# Patient Record
Sex: Male | Born: 1950 | Hispanic: Yes | Marital: Married | Smoking: Former smoker
Health system: Southern US, Community
[De-identification: ages and names within clinical notes are randomized; demographics above are authoritative.]

## PROBLEM LIST (undated history)

## (undated) DIAGNOSIS — I2510 Atherosclerotic heart disease of native coronary artery without angina pectoris: Secondary | ICD-10-CM

## (undated) DIAGNOSIS — E119 Type 2 diabetes mellitus without complications: Secondary | ICD-10-CM

## (undated) DIAGNOSIS — IMO0002 Reserved for concepts with insufficient information to code with codable children: Secondary | ICD-10-CM

## (undated) DIAGNOSIS — I219 Acute myocardial infarction, unspecified: Secondary | ICD-10-CM

## (undated) DIAGNOSIS — E785 Hyperlipidemia, unspecified: Secondary | ICD-10-CM

---

## 2013-04-05 DIAGNOSIS — I2510 Atherosclerotic heart disease of native coronary artery without angina pectoris: Secondary | ICD-10-CM

## 2013-04-05 HISTORY — DX: Atherosclerotic heart disease of native coronary artery without angina pectoris: I25.10

## 2014-09-17 DIAGNOSIS — I219 Acute myocardial infarction, unspecified: Secondary | ICD-10-CM

## 2014-09-17 HISTORY — PX: CORONARY ANGIOPLASTY WITH STENT PLACEMENT: SHX49

## 2014-09-17 HISTORY — DX: Acute myocardial infarction, unspecified: I21.9

## 2015-08-01 ENCOUNTER — Emergency Department (HOSPITAL_COMMUNITY): Payer: Self-pay

## 2015-08-01 ENCOUNTER — Encounter (HOSPITAL_COMMUNITY): Payer: Self-pay | Admitting: *Deleted

## 2015-08-01 ENCOUNTER — Inpatient Hospital Stay (HOSPITAL_COMMUNITY)
Admission: EM | Admit: 2015-08-01 | Discharge: 2015-08-06 | DRG: 291 | Disposition: A | Discharge status: Home / Self Care | Payer: Self-pay | Attending: Internal Medicine | Admitting: Internal Medicine

## 2015-08-01 DIAGNOSIS — I249 Acute ischemic heart disease, unspecified: Secondary | ICD-10-CM | POA: Insufficient documentation

## 2015-08-01 DIAGNOSIS — I509 Heart failure, unspecified: Secondary | ICD-10-CM | POA: Insufficient documentation

## 2015-08-01 DIAGNOSIS — E118 Type 2 diabetes mellitus with unspecified complications: Secondary | ICD-10-CM | POA: Diagnosis present

## 2015-08-01 DIAGNOSIS — E1122 Type 2 diabetes mellitus with diabetic chronic kidney disease: Secondary | ICD-10-CM | POA: Diagnosis present

## 2015-08-01 DIAGNOSIS — E119 Type 2 diabetes mellitus without complications: Secondary | ICD-10-CM

## 2015-08-01 DIAGNOSIS — N184 Chronic kidney disease, stage 4 (severe): Secondary | ICD-10-CM | POA: Diagnosis present

## 2015-08-01 DIAGNOSIS — D696 Thrombocytopenia, unspecified: Secondary | ICD-10-CM

## 2015-08-01 DIAGNOSIS — M4854XA Collapsed vertebra, not elsewhere classified, thoracic region, initial encounter for fracture: Secondary | ICD-10-CM | POA: Diagnosis present

## 2015-08-01 DIAGNOSIS — Z79899 Other long term (current) drug therapy: Secondary | ICD-10-CM

## 2015-08-01 DIAGNOSIS — R001 Bradycardia, unspecified: Secondary | ICD-10-CM | POA: Diagnosis not present

## 2015-08-01 DIAGNOSIS — I130 Hypertensive heart and chronic kidney disease with heart failure and stage 1 through stage 4 chronic kidney disease, or unspecified chronic kidney disease: Principal | ICD-10-CM | POA: Diagnosis present

## 2015-08-01 DIAGNOSIS — E785 Hyperlipidemia, unspecified: Secondary | ICD-10-CM

## 2015-08-01 DIAGNOSIS — R911 Solitary pulmonary nodule: Secondary | ICD-10-CM

## 2015-08-01 DIAGNOSIS — I252 Old myocardial infarction: Secondary | ICD-10-CM

## 2015-08-01 DIAGNOSIS — N179 Acute kidney failure, unspecified: Secondary | ICD-10-CM | POA: Diagnosis present

## 2015-08-01 DIAGNOSIS — I5043 Acute on chronic combined systolic (congestive) and diastolic (congestive) heart failure: Secondary | ICD-10-CM | POA: Diagnosis present

## 2015-08-01 DIAGNOSIS — I5041 Acute combined systolic (congestive) and diastolic (congestive) heart failure: Secondary | ICD-10-CM | POA: Insufficient documentation

## 2015-08-01 DIAGNOSIS — N189 Chronic kidney disease, unspecified: Secondary | ICD-10-CM | POA: Diagnosis present

## 2015-08-01 DIAGNOSIS — Z955 Presence of coronary angioplasty implant and graft: Secondary | ICD-10-CM

## 2015-08-01 DIAGNOSIS — Z7901 Long term (current) use of anticoagulants: Secondary | ICD-10-CM

## 2015-08-01 DIAGNOSIS — Z794 Long term (current) use of insulin: Secondary | ICD-10-CM

## 2015-08-01 DIAGNOSIS — R0602 Shortness of breath: Secondary | ICD-10-CM | POA: Diagnosis present

## 2015-08-01 DIAGNOSIS — T45511A Poisoning by anticoagulants, accidental (unintentional), initial encounter: Secondary | ICD-10-CM | POA: Diagnosis present

## 2015-08-01 DIAGNOSIS — Z7902 Long term (current) use of antithrombotics/antiplatelets: Secondary | ICD-10-CM

## 2015-08-01 DIAGNOSIS — N183 Chronic kidney disease, stage 3 (moderate): Secondary | ICD-10-CM | POA: Diagnosis present

## 2015-08-01 DIAGNOSIS — I25119 Atherosclerotic heart disease of native coronary artery with unspecified angina pectoris: Secondary | ICD-10-CM

## 2015-08-01 DIAGNOSIS — I255 Ischemic cardiomyopathy: Secondary | ICD-10-CM | POA: Diagnosis present

## 2015-08-01 DIAGNOSIS — T604X1A Toxic effect of rodenticides, accidental (unintentional), initial encounter: Secondary | ICD-10-CM

## 2015-08-01 DIAGNOSIS — E1165 Type 2 diabetes mellitus with hyperglycemia: Secondary | ICD-10-CM

## 2015-08-01 DIAGNOSIS — I2510 Atherosclerotic heart disease of native coronary artery without angina pectoris: Secondary | ICD-10-CM | POA: Diagnosis present

## 2015-08-01 HISTORY — DX: Reserved for concepts with insufficient information to code with codable children: IMO0002

## 2015-08-01 HISTORY — DX: Hyperlipidemia, unspecified: E78.5

## 2015-08-01 HISTORY — DX: Type 2 diabetes mellitus without complications: E11.9

## 2015-08-01 HISTORY — DX: Acute myocardial infarction, unspecified: I21.9

## 2015-08-01 HISTORY — DX: Atherosclerotic heart disease of native coronary artery without angina pectoris: I25.10

## 2015-08-01 LAB — I-STAT TROPONIN, ED: Troponin i, poc: 0.05 ng/mL (ref 0.00–0.08)

## 2015-08-01 LAB — BASIC METABOLIC PANEL
Anion gap: 11 (ref 5–15)
BUN: 78 mg/dL — AB (ref 6–20)
CO2: 22 mmol/L (ref 22–32)
Calcium: 8.9 mg/dL (ref 8.9–10.3)
Chloride: 105 mmol/L (ref 101–111)
Creatinine, Ser: 2.19 mg/dL — ABNORMAL HIGH (ref 0.61–1.24)
GFR calc Af Amer: 34 mL/min — ABNORMAL LOW (ref >60)
GFR, EST NON AFRICAN AMERICAN: 30 mL/min — AB (ref >60)
GLUCOSE: 302 mg/dL — AB (ref 65–99)
POTASSIUM: 4.6 mmol/L (ref 3.5–5.1)
Sodium: 138 mmol/L (ref 135–145)

## 2015-08-01 LAB — PROTIME-INR
INR: 4.76 — ABNORMAL HIGH (ref 0.00–1.49)
Prothrombin Time: 43.3 seconds — ABNORMAL HIGH (ref 11.6–15.2)

## 2015-08-01 LAB — CBC
HEMATOCRIT: 35.6 % — AB (ref 39.0–52.0)
Hemoglobin: 11 g/dL — ABNORMAL LOW (ref 13.0–17.0)
MCH: 23.4 pg — ABNORMAL LOW (ref 26.0–34.0)
MCHC: 30.9 g/dL (ref 30.0–36.0)
MCV: 75.6 fL — AB (ref 78.0–100.0)
Platelets: 143 10*3/uL — ABNORMAL LOW (ref 150–400)
RBC: 4.71 MIL/uL (ref 4.22–5.81)
RDW: 17.4 % — AB (ref 11.5–15.5)
WBC: 8.1 10*3/uL (ref 4.0–10.5)

## 2015-08-01 LAB — TROPONIN I: TROPONIN I: 0.04 ng/mL — AB (ref <0.031)

## 2015-08-01 LAB — GLUCOSE, CAPILLARY: GLUCOSE-CAPILLARY: 143 mg/dL — AB (ref 65–99)

## 2015-08-01 LAB — BRAIN NATRIURETIC PEPTIDE: B Natriuretic Peptide: 2316.8 pg/mL — ABNORMAL HIGH (ref 0.0–100.0)

## 2015-08-01 IMAGING — CT CT CHEST W/O CM
2 of 4 series · 15 of 36 positions shown, 18 images · non-contrast
Comparison: [DATE]

CLINICAL DATA: Chest pain and shortness of breath for 2 weeks,
possible pulmonary nodule

EXAM:
CT CHEST WITHOUT CONTRAST
TECHNIQUE: Multidetector CT imaging of the chest was performed following the
standard protocol without IV contrast.

[Series 4: chest w/o 1mm st · axial · non-contrast · 0.94mm/px · z∈[-322,-23]mm · 12 of 420 slices shown, 15 images]
[im 23/420  mediastinal]
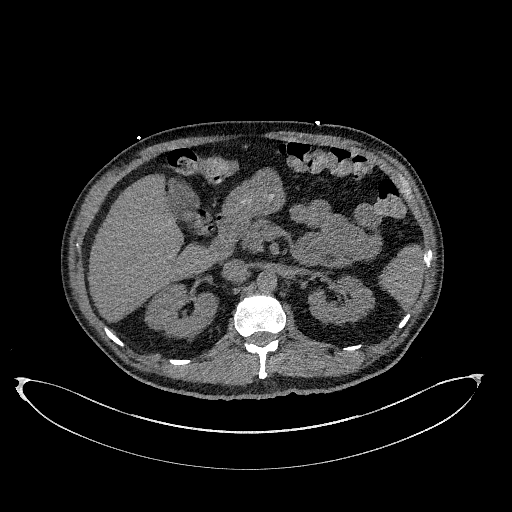
[im 23/420  lung]
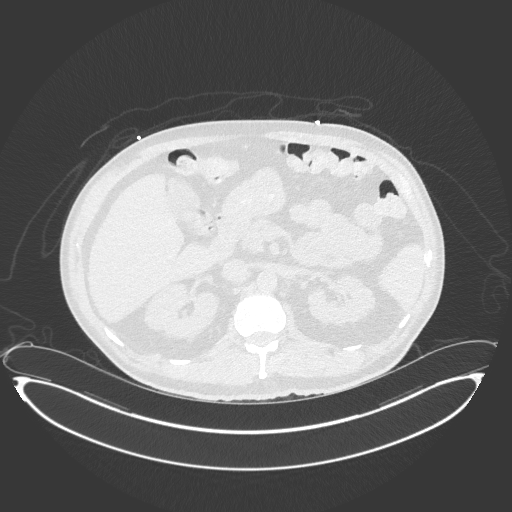
[im 67/420  lung]
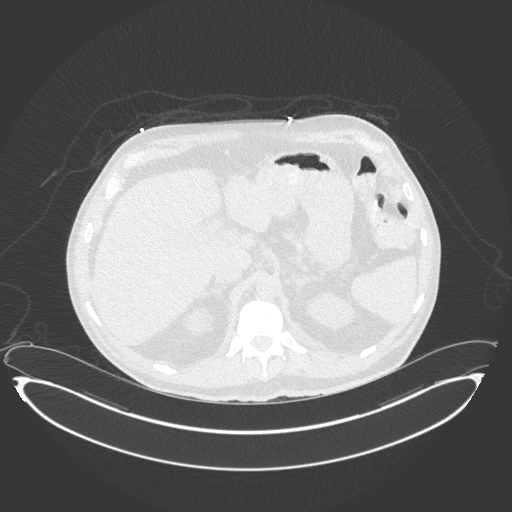
[im 89/420  lung]
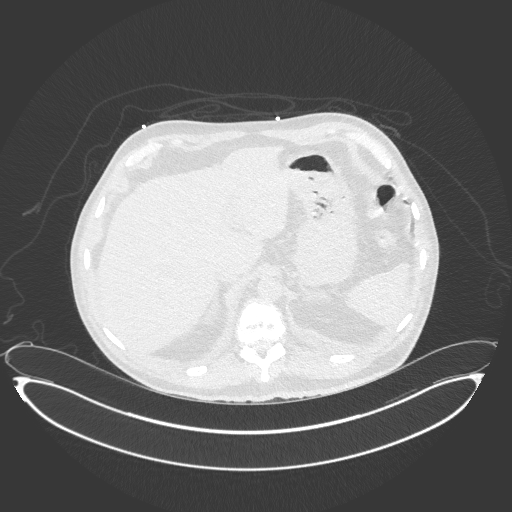
[im 133/420  lung]
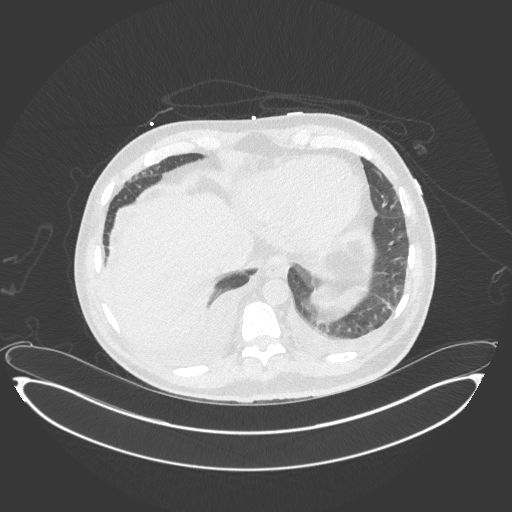
[im 155/420  mediastinal]
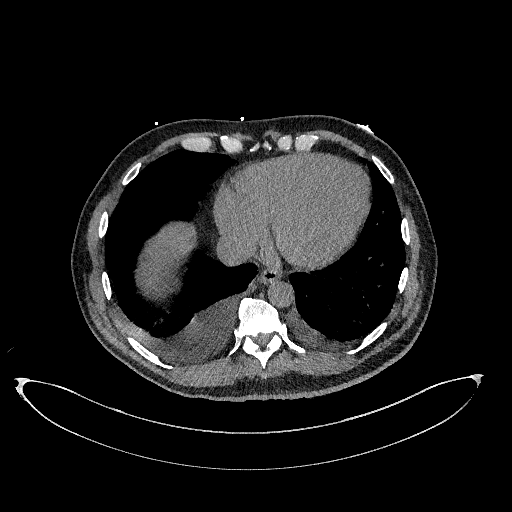
[im 155/420  lung]
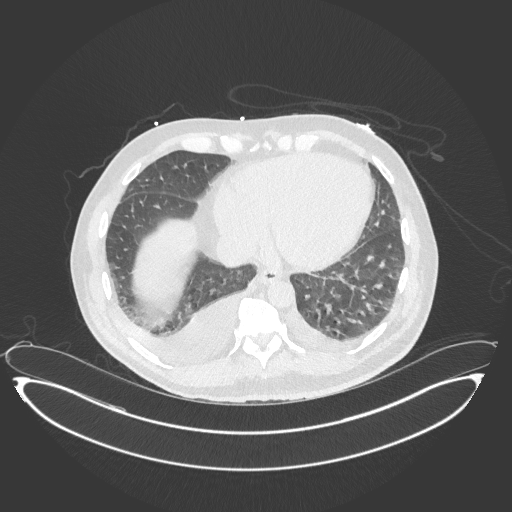
[im 199/420  lung]
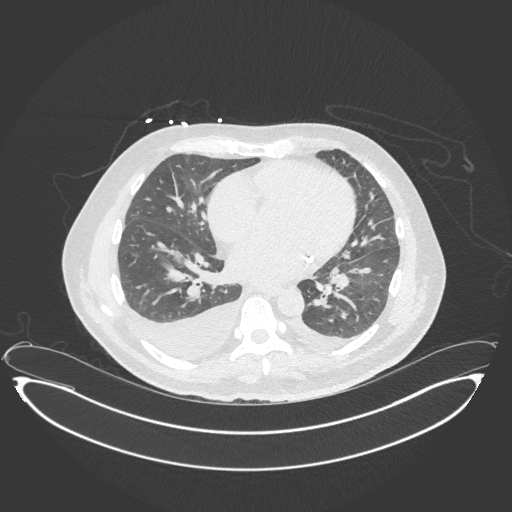
[im 221/420  lung]
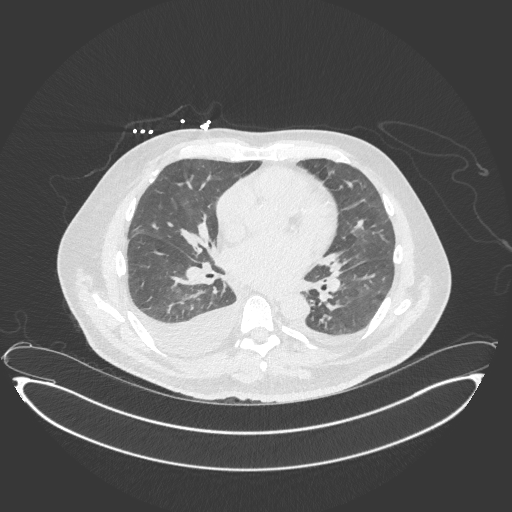
[im 265/420  lung]
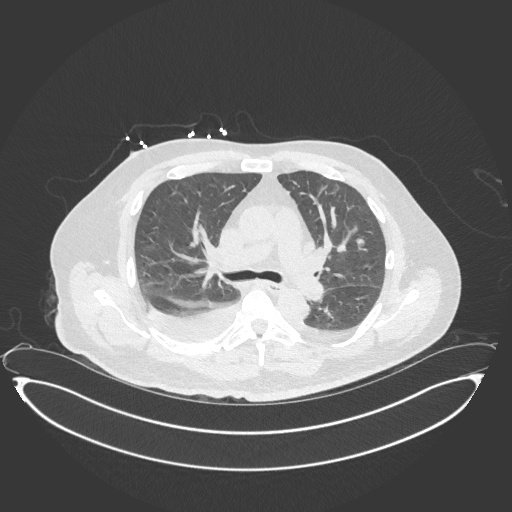
[im 287/420  mediastinal]
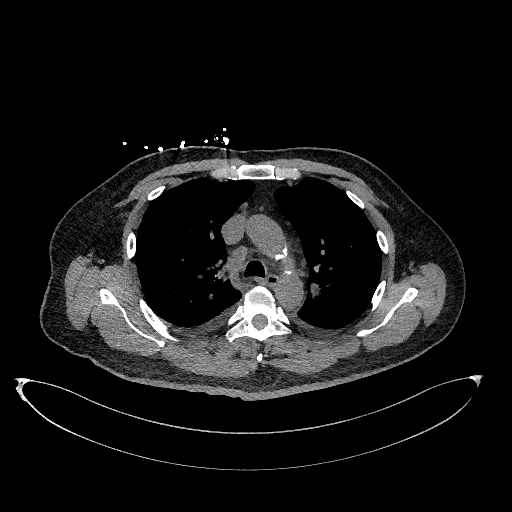
[im 287/420  lung]
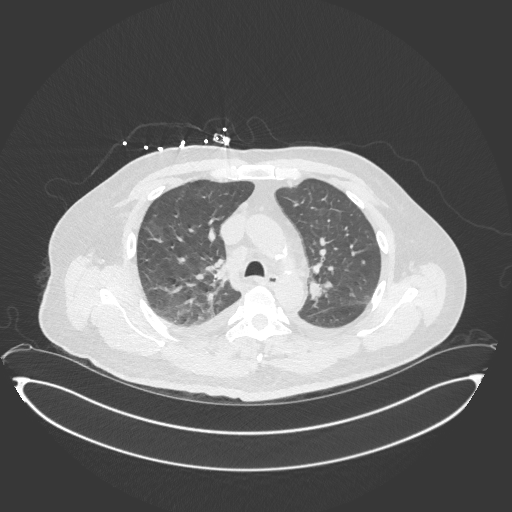
[im 331/420  lung]
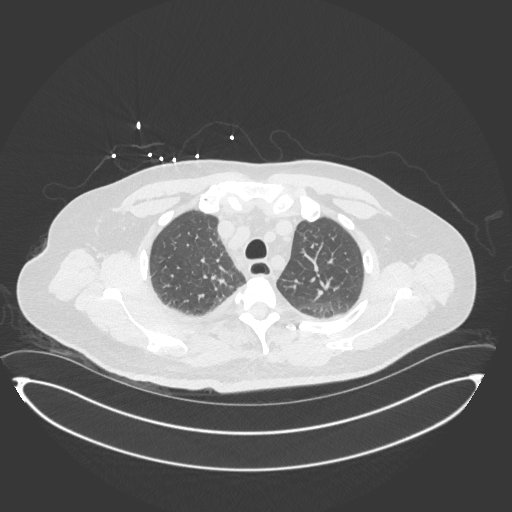
[im 353/420  lung]
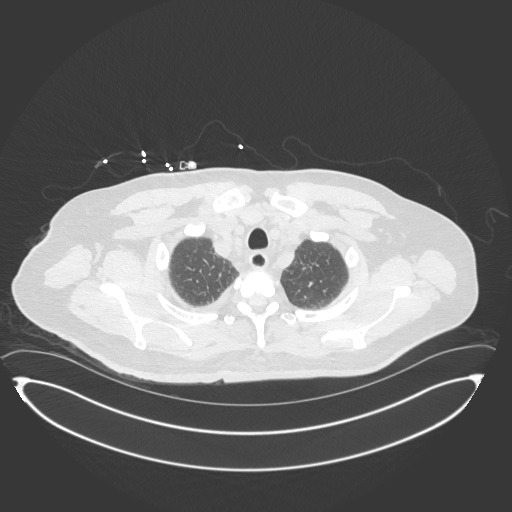
[im 397/420  lung]
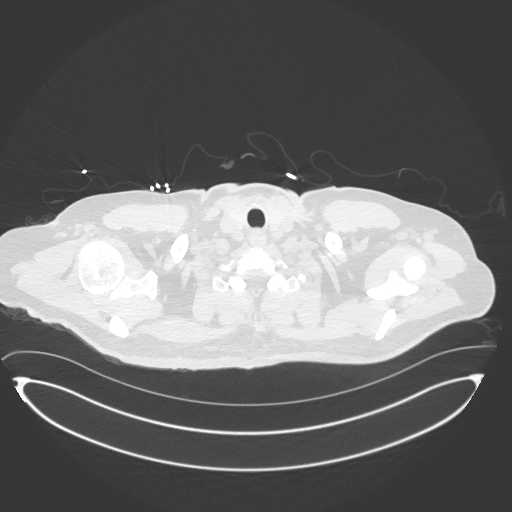

[Series 5: chest w/o 3mm st cor · coronal · non-contrast · 0.66mm/px · 3 of 97 slices shown]
[im 20/97  lung]
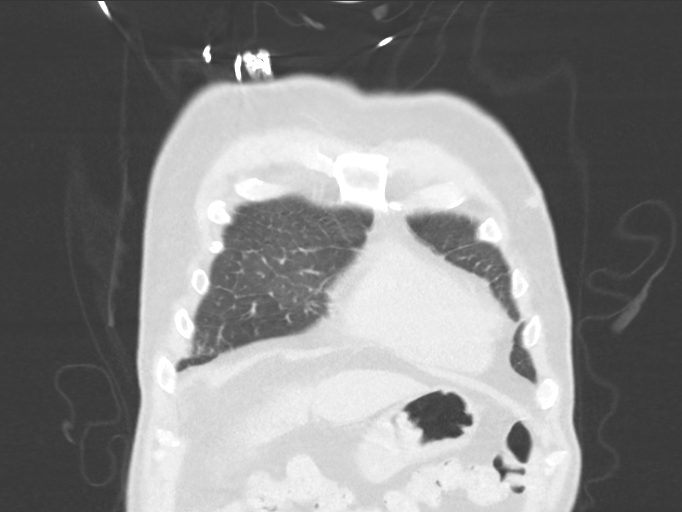
[im 39/97  lung]
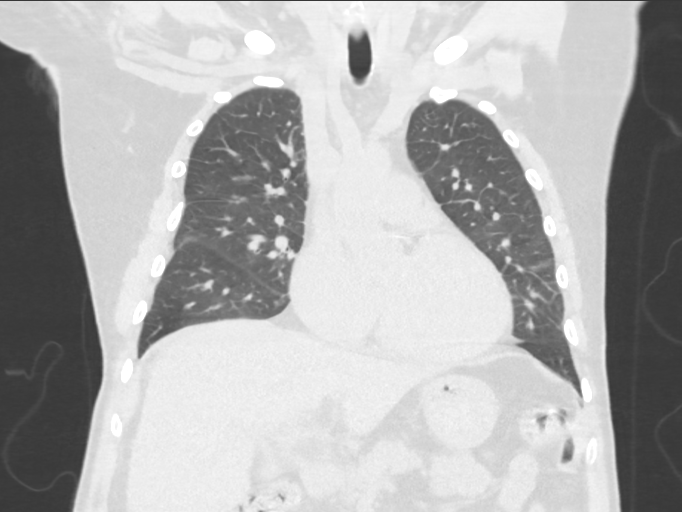
[im 58/97  lung]
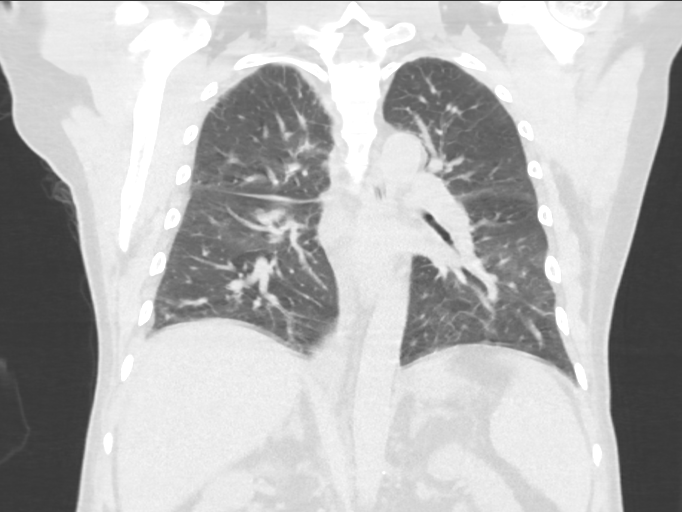

[15 of 36 positions shown; findings below may reference images not displayed]

FINDINGS: The study is limited by respiratory motion. There is a pulmonary
nodule in the left upper lobe as suspected on the chest radiograph.
It measures approximately 9 mm. Measurements are in precise given
the motion.

Kerley B-lines noted bilaterally in the lower third lung zones.
There is a small left and moderate right pleural effusion.

Given limited evaluation without contrast, no significant hilar mass
or adenopathy. There are prominent mediastinal lymph nodes, the
largest in the precarinal region measuring 13 mm.

No significant pericardial effusion. There is cardiac enlargement,
mild. There is coronary artery calcification involving all 3
coronary arteries.

Images through the upper abdomen demonstrate no acute findings.
There is an approximately 50% compression deformity involving the
anterior half of the T12 vertebral body. The endplates are involves
both superiorly and inferiorly but both appear corticated. There is
T11-12 and T12-L1 vacuum phenomenon in the disc. No significant
retropulsion.
IMPRESSION: Findings consistent with clearing pulmonary edema with bilateral
pleural effusions.

9 mm left upper lobe pulmonary nodule. Consider one of the following
in 3 months for both low-risk and high-risk individuals: (a) repeat
chest CT, (b) follow-up PET-CT, or (c) tissue sampling. This
recommendation follows the consensus statement: Guidelines for
Management of Incidental Pulmonary Nodules Detected on CT
Images:From the [HOSPITAL] [3M]; published online before
print (10.1148/radiol.[PHONE_NUMBER]).

T12 compression deformity appears subacute to chronic.

## 2015-08-01 MED ORDER — POTASSIUM CHLORIDE CRYS ER 10 MEQ PO TBCR
10.0000 meq | EXTENDED_RELEASE_TABLET | Freq: Two times a day (BID) | ORAL | Status: DC
  Administered 2015-08-01 – 2015-08-06 (×10): 10 meq via ORAL
  Filled 2015-08-01 (×10): qty 1

## 2015-08-01 MED ORDER — MORPHINE SULFATE (PF) 2 MG/ML IV SOLN: 2.0000 mg | INTRAVENOUS | Status: DC | PRN

## 2015-08-01 MED ORDER — IVABRADINE HCL 7.5 MG PO TABS
7.5000 mg | ORAL_TABLET | Freq: Two times a day (BID) | ORAL | Status: DC
  Administered 2015-08-02 – 2015-08-03 (×4): 7.5 mg via ORAL
  Filled 2015-08-01 (×5): qty 1

## 2015-08-01 MED ORDER — CLOPIDOGREL BISULFATE 75 MG PO TABS
75.0000 mg | ORAL_TABLET | Freq: Every day | ORAL | Status: DC
  Administered 2015-08-02 – 2015-08-06 (×5): 75 mg via ORAL
  Filled 2015-08-01 (×5): qty 1

## 2015-08-01 MED ORDER — CARVEDILOL 12.5 MG PO TABS
12.5000 mg | ORAL_TABLET | Freq: Every day | ORAL | Status: DC
  Administered 2015-08-02 – 2015-08-06 (×5): 12.5 mg via ORAL
  Filled 2015-08-01 (×6): qty 1

## 2015-08-01 MED ORDER — PANTOPRAZOLE SODIUM 40 MG PO TBEC
40.0000 mg | DELAYED_RELEASE_TABLET | Freq: Every day | ORAL | Status: DC
Start: 2015-08-02 — End: 2015-08-06
  Administered 2015-08-02 – 2015-08-06 (×5): 40 mg via ORAL
  Filled 2015-08-01 (×5): qty 1

## 2015-08-01 MED ORDER — FUROSEMIDE 10 MG/ML IJ SOLN
60.0000 mg | Freq: Once | INTRAMUSCULAR | Status: AC
  Administered 2015-08-01: 60 mg via INTRAVENOUS
  Filled 2015-08-01: qty 6

## 2015-08-01 MED ORDER — ONDANSETRON HCL 4 MG/2ML IJ SOLN
4.0000 mg | Freq: Four times a day (QID) | INTRAMUSCULAR | Status: DC | PRN
  Filled 2015-08-01: qty 2

## 2015-08-01 MED ORDER — INSULIN NPH (HUMAN) (ISOPHANE) 100 UNIT/ML ~~LOC~~ SUSP
30.0000 [IU] | Freq: Two times a day (BID) | SUBCUTANEOUS | Status: DC
  Administered 2015-08-01 – 2015-08-06 (×9): 30 [IU] via SUBCUTANEOUS
  Filled 2015-08-01 (×2): qty 10

## 2015-08-01 MED ORDER — NITROGLYCERIN 2 % TD OINT
1.0000 [in_us] | TOPICAL_OINTMENT | Freq: Once | TRANSDERMAL | Status: AC
  Administered 2015-08-01: 1 [in_us] via TOPICAL
  Filled 2015-08-01: qty 1

## 2015-08-01 MED ORDER — INSULIN ASPART 100 UNIT/ML ~~LOC~~ SOLN
0.0000 [IU] | Freq: Three times a day (TID) | SUBCUTANEOUS | Status: DC
  Administered 2015-08-02 – 2015-08-04 (×5): 2 [IU] via SUBCUTANEOUS
  Administered 2015-08-04: 9 [IU] via SUBCUTANEOUS
  Administered 2015-08-05: 3 [IU] via SUBCUTANEOUS
  Administered 2015-08-05: 2 [IU] via SUBCUTANEOUS
  Administered 2015-08-06: 3 [IU] via SUBCUTANEOUS
  Administered 2015-08-06: 9 [IU] via SUBCUTANEOUS

## 2015-08-01 MED ORDER — ACETAMINOPHEN 325 MG PO TABS: 650.0000 mg | ORAL_TABLET | ORAL | Status: DC | PRN

## 2015-08-01 MED ORDER — INSULIN ASPART 100 UNIT/ML ~~LOC~~ SOLN
8.0000 [IU] | Freq: Once | SUBCUTANEOUS | Status: AC
  Administered 2015-08-01: 8 [IU] via INTRAVENOUS
  Filled 2015-08-01: qty 1

## 2015-08-01 MED ORDER — FUROSEMIDE 10 MG/ML IJ SOLN
60.0000 mg | Freq: Two times a day (BID) | INTRAMUSCULAR | Status: DC
  Administered 2015-08-02 – 2015-08-04 (×5): 60 mg via INTRAVENOUS
  Filled 2015-08-01 (×5): qty 6

## 2015-08-01 MED ORDER — NITROGLYCERIN 2 % TD OINT
1.0000 [in_us] | TOPICAL_OINTMENT | Freq: Four times a day (QID) | TRANSDERMAL | Status: DC
  Administered 2015-08-01 – 2015-08-04 (×11): 1 [in_us] via TOPICAL
  Filled 2015-08-01: qty 30

## 2015-08-01 MED ORDER — ATORVASTATIN CALCIUM 80 MG PO TABS
80.0000 mg | ORAL_TABLET | Freq: Every day | ORAL | Status: DC
  Administered 2015-08-02 – 2015-08-06 (×5): 80 mg via ORAL
  Filled 2015-08-01 (×5): qty 1

## 2015-08-01 MED ORDER — INSULIN ASPART 100 UNIT/ML ~~LOC~~ SOLN
0.0000 [IU] | Freq: Every day | SUBCUTANEOUS | Status: DC
Start: 2015-08-01 — End: 2015-08-06
  Administered 2015-08-04: 4 [IU] via SUBCUTANEOUS
  Administered 2015-08-05: 3 [IU] via SUBCUTANEOUS

## 2015-08-01 NOTE — H&P (Addendum)
[facility] History and Physical  Duane Martinez UJW:[id number] DOB: 1950/10/19 DOA: 08/01/2015  PCP: The patient is from [place], and he has a cardiologist there.  Chief Complaint: Progressive dyspnea on exertion, chest pain, leg swelling  HPI: Duane Martinez is a 65 y.o. gentleman from [place] who is Spanish-speaking.  History is obtained by using the telephone interpreter services in the ED.  His daughter is at bedside and speak very little [place] as well.  The patient arrived in the [place] two days ago; he is here visiting.  He has a history of CAD, prior stents x 3 (most recent in June 2016), DM, dyslipidemia, and CHF (he does not seem to recognize this term but he is on a specific medication for CHF).  He reports that he was actually having intermittent exertional chest pain, dry cough, and shortness of breath prior to home.  However, he made the trip to the [place] successfully, and he has had progressive symptoms since.  He is complaining of symptoms consistent with orthopnea and PND.  He has had light-headedness but no syncope.  He has had increasing lower extremity edema.  He estimates that he has gained 5-6 lbs.  No nausea, vomiting, or diaphoresis.  He follows a low sodium diet and fluid restriction at home.  ED evaluation concerning for cardiomegaly with interstitial edema on his chest xray.  He also has a BNP of 2300.  First troponin is 0.05.  No acute EKG changes, but clinical picture concerning for CHF.  He has received nitropaste and lasix  IV x 1 in the ED.  Hospitalist asked to admit.  Of note, he is on warfarin, as prescribed by his cardiologist in [place].  Per the patient, the indication was CAD.  He denies history of PE, DVT, or arrhythmia.  Of note, he has several pages of outside medical records; primarily in [place].  However, it appears that his baseline creatinine is around 2.3.  It also appears that prior echos have shown a preserved LV EF.  He has a chart depicting  his coronary anatomy that suggests that he has multivessel disease.  The patient and his daughter have been advised to keep these records at the bedside for review by other providers/specialists as needed.    Review of Systems: No fever.  Intermittent blurred vision, present prior to arrival in the [place].  No history of falls.  Dry cough.  Intermittent constipation.  Otherwise, 12 systems reviewed and negative except as stated in the HPI.  Past Medical History  Diagnosis Date  . Myocardial infarct (HCC)   . Coronary artery disease   . Diabetes mellitus without complication (HCC)   . Dyslipidemia   . Compression fracture   I suspect he has CKD secondary to DM  The patient denies any past surgeries.  Social History:  Social History   Social History Narrative  . No narrative on file  Remote tobacco use.  No EtOH or illicit drug use.  He is married.  He has three children.  No Known Allergies  Family History  Problem Relation Age of Onset  . Heart attack Mother   He denies any known family history of diabetes or cancers.  Prior to Admission medications   Medication Sig Start Date End Date Taking? Authorizing Provider  acetaminophen (TYLENOL) 500 MG tablet Take 500 mg by mouth every 8 (eight) hours as needed for moderate pain.   Yes Historical Provider, MD  atorvastatin (LIPITOR) 80 MG tablet Take 80 mg by mouth  daily at 6 PM.   Yes Historical Provider, MD  carvedilol (COREG) 12.5 MG tablet Take 12.5 mg by mouth daily.    Yes Historical Provider, MD  clopidogrel (PLAVIX) 75 MG tablet Take 75 mg by mouth 2 (two) times daily.   Yes Historical Provider, MD  furosemide (LASIX) 40 MG tablet Take 40 mg by mouth 2 (two) times daily.    Yes Historical Provider, MD  insulin NPH Human (HUMULIN N,NOVOLIN N) 100 UNIT/ML injection Inject 30 Units into the skin 2 (two) times daily before a meal.   Yes Historical Provider, MD  ivabradine (CORLANOR) 7.5 MG TABS tablet Take 7.5 mg by mouth 2 (two) times  daily with a meal.   Yes Historical Provider, MD  lansoprazole (PREVACID) 30 MG capsule Take 30 mg by mouth daily at 12 noon.   Yes Historical Provider, MD  Multiple Vitamin (MULTIVITAMIN WITH MINERALS) TABS tablet Take 1 tablet by mouth 2 (two) times daily.   Yes Historical Provider, MD  warfarin (COUMADIN) 5 MG tablet Take 5 mg by mouth daily at 6 PM. 2.5MG  ON MON, WED FRI AND SUN TAKES 5MG  ALL OTHER DAYS   Yes Historical Provider, MD   Physical Exam: Filed Vitals:   08/01/15 1930 08/01/15 1945 08/01/15 2000 08/01/15 2044  BP: 150/98 149/97 155/94 138/78  Pulse: 58 58 59 56  Temp:    98.6 F (37 C)  TempSrc:    Oral  Resp: 17 21  20   Height:    5\' 7"  (1.702 m)  Weight:    82.01 kg (180 lb 12.8 oz)  SpO2: 100% 100% 100% 100%     General:  Awake and alert.  Oriented to person, place, time and situation.  NAD.  Chest pain free at the time of my exam.  Head: [place]/AT  Eyes: PERRL bilaterally, conjunctiva are pink.  EOMI.  ENT: Mucous membranes are moist.  No nasal drainage.  Neck is supple.  Cardiovascular: NR/RR.  He has a split S2.  He has 1+ pitting edema in bilateral lower extremities.    Respiratory: CTA bilaterally. No significant ronchi or wheezing.    GI: Abdomen is soft/NT/ND.  Bowel sounds are present.  No guarding.  Skin: Warm and dry.  Musculoskeletal: Moves all four extremities spontaneously.  Psychiatric: Normal affect.  Neurologic: No focal deficits.   Labs on Admission:  Basic Metabolic Panel:  Recent Labs Lab 08/01/15 1610  NA 138  K 4.6  CL 105  CO2 22  GLUCOSE 302*  BUN 78*  CREATININE 2.19*  CALCIUM 8.9   CBC:  Recent Labs Lab 08/01/15 1610  WBC 8.1  HGB 11.0*  HCT 35.6*  MCV 75.6*  PLT 143*   BNP (last 3 results)  Recent Labs  08/01/15 1732  BNP 2316.8*   CBG:  Recent Labs Lab 08/01/15 2132  GLUCAP 143*   INR 4.76  Radiological Exams on Admission: Dg Chest 2 View  08/01/2015  CLINICAL DATA:  Chest pain and  tightness. Shortness of breath. Prior myocardial infarction in June. Productive cough. EXAM: CHEST  2 VIEW COMPARISON:  None FINDINGS: Mild enlargement of the cardiopericardial silhouette with indistinct pulmonary vasculature and mild interstitial accentuation including Kerley B-lines, favoring a low-level interstitial edema. No airspace edema identified. Bifid right fifth rib. On the left side projecting over the anterior fourth rib there is a 10 mm nodular density. T12 anterior wedge compression, age indeterminate. IMPRESSION: 1. 10 mm nodular density projecting in over the left mid lung. CT  chest (with contrast if feasible) is recommended to assess for malignancy. 2. Mild cardiomegaly with interstitial edema including Kerley B-lines. 3. Incidental bifid right fifth rib. Electronically Signed   By: Gaylyn Rong M.D.   On: 08/01/2015 16:24   Ct Chest Wo Contrast  08/01/2015  CLINICAL DATA:  Chest pain and shortness of breath for 2 weeks, possible pulmonary nodule EXAM: CT CHEST WITHOUT CONTRAST TECHNIQUE: Multidetector CT imaging of the chest was performed following the standard protocol without IV contrast. COMPARISON:  08/01/2015 FINDINGS: The study is limited by respiratory motion. There is a pulmonary nodule in the left upper lobe as suspected on the chest radiograph. It measures approximately 9 mm. Measurements are in precise given the motion. Kerley B-lines noted bilaterally in the lower third lung zones. There is a small left and moderate right pleural effusion. Given limited evaluation without contrast, no significant hilar mass or adenopathy. There are prominent mediastinal lymph nodes, the largest in the precarinal region measuring 13 mm. No significant pericardial effusion. There is cardiac enlargement, mild. There is coronary artery calcification involving all 3 coronary arteries. Images through the upper abdomen demonstrate no acute findings. There is an approximately 50% compression deformity  involving the anterior half of the T12 vertebral body. The endplates are involves both superiorly and inferiorly but both appear corticated. There is T11-12 and T12-L1 vacuum phenomenon in the disc. No significant retropulsion. IMPRESSION: Findings consistent with clearing pulmonary edema with bilateral pleural effusions. 9 mm left upper lobe pulmonary nodule. Consider one of the following in 3 months for both low-risk and high-risk individuals: (a) repeat chest CT, (b) follow-up PET-CT, or (c) tissue sampling. This recommendation follows the consensus statement: Guidelines for Management of Incidental Pulmonary Nodules Detected on CT Images:From the Fleischner Society 2017; published online before print (10.1148/radiol.1610960454). T12 compression deformity appears subacute to chronic. Electronically Signed   By: Esperanza Heir M.D.   On: 08/01/2015 18:12    EKG: Independently reviewed. NSR, no acute ST segment elevation  Assessment/Plan Principal Problem:   CHF (congestive heart failure) (HCC) Active Problems:   CAD (coronary artery disease)   Type 2 diabetes mellitus (HCC)   CKD stage 3 due to type 2 diabetes mellitus (HCC)   Dyslipidemia   Warfarin toxicity   Thrombocytopenia (HCC)   Pulmonary nodule  Admit to telemetry  Probable acute exacerbation of chronic diastolic heart failure --Diuresis with IV lasix --1500 cc fluid restriction --Strict I/O, daily weights --Echo in the morning --Serial troponin --Anticipate he will need cardiology consult in the AM --Continue his home dose of ivabradine for now  History of CAD --Serial troponin to see if he rules in for ACS --Repeat EKG in the AM --Continue plavix once daily  (it is prescribed twice daily by his cardiologist in [place] for unclear reasons) --Will not give aspirin now since he is anticoagulated with warfarin with a supratherapeutic INR and he has been on plavix BID  DM --Check A1c --Will continue his NPH BID plus SSI  AC/HS  History of dyslipidemia --His currently on Max dose statin  Incidental finding of pulmonary nodule --He will need surveillance as outpatient  Incidental finding of T 12 compression fracture --Likely related to patient's known history, self-reported  Code Status: FULL Family Communication: Daughter at bedside Disposition Plan: To be be determined based on response to initial therapy.  The patient and his daughter were given the chance ask questions re: the plan of care while the interpreter was on the telephone.  Time spent:  75 minutes, interview and review of medical records alone took over 45 minutes due to the language barrier  Jerene Bears [facility]  08/01/2015, 10:34 PM

## 2015-08-01 NOTE — ED Provider Notes (Signed)
CSN: [id number]     Arrival date & time 08/01/15  1551 History   First MD Initiated Contact with Patient 08/01/15 1652     Chief Complaint  Patient presents with  . Chest Pain  . Shortness of Breath   PT IS HERE WITH CP AND SOB.  THE PT SAID THAT HE IS VISITING HERE FROM [place].  HE FLEW IN 2 DAYS AGO.  SINCE THEN, HE HAS HAD CP AND SOB.  CP IS GONE NOW.  PT C/O SWELLING TO BOTH LEGS THAT STARTED AFTER THE FLIGHT.  THE PT DOES HAVE A CARDIAC HX AND HAS HAD 3 PRIOR STENTS.  THE LAST WAS LAST YEAR.   (Consider location/radiation/quality/duration/timing/severity/associated sxs/prior Treatment) Patient is a 65 y.o. male presenting with chest pain and shortness of breath. The history is provided by the patient and a relative. The history is limited by a language barrier. A language interpreter was used.  Chest Pain Pain location:  Substernal area Pain quality: pressure   Pain radiates to:  Does not radiate Pain radiates to the back: no   Pain severity:  Mild Onset quality:  Sudden Timing:  Intermittent Progression:  Resolved Chronicity:  Recurrent Relieved by:  Nothing Associated symptoms: shortness of breath   Shortness of Breath Associated symptoms: chest pain     Past Medical History  Diagnosis Date  . Myocardial infarct (HCC)   . Coronary artery disease   . Diabetes mellitus without complication (HCC)    History reviewed. No pertinent past surgical history. History reviewed. No pertinent family history. Social History  Substance Use Topics  . Smoking status: Never Smoker   . Smokeless tobacco: None  . Alcohol Use: No    Review of Systems  Respiratory: Positive for shortness of breath.   Cardiovascular: Positive for chest pain.  All other systems reviewed and are negative.     Allergies  Review of patient's allergies indicates no known allergies.  Home Medications   Prior to Admission medications   Medication Sig Start Date End Date Taking? Authorizing  Provider  acetaminophen (TYLENOL) 500 MG tablet Take 500 mg by mouth every 8 (eight) hours as needed for moderate pain.   Yes Historical Provider, MD  atorvastatin (LIPITOR) 80 MG tablet Take 80 mg by mouth daily at 6 PM.   Yes Historical Provider, MD  carvedilol (COREG) 12.5 MG tablet Take 12.5 mg by mouth daily.    Yes Historical Provider, MD  clopidogrel (PLAVIX) 75 MG tablet Take 75 mg by mouth 2 (two) times daily.   Yes Historical Provider, MD  furosemide (LASIX) 40 MG tablet Take 40 mg by mouth 2 (two) times daily.    Yes Historical Provider, MD  insulin NPH Human (HUMULIN N,NOVOLIN N) 100 UNIT/ML injection Inject 30 Units into the skin 2 (two) times daily before a meal.   Yes Historical Provider, MD  ivabradine (CORLANOR) 7.5 MG TABS tablet Take 7.5 mg by mouth 2 (two) times daily with a meal.   Yes Historical Provider, MD  lansoprazole (PREVACID) 30 MG capsule Take 30 mg by mouth daily at 12 noon.   Yes Historical Provider, MD  Multiple Vitamin (MULTIVITAMIN WITH MINERALS) TABS tablet Take 1 tablet by mouth 2 (two) times daily.   Yes Historical Provider, MD  warfarin (COUMADIN) 5 MG tablet Take 5 mg by mouth daily at 6 PM. 2.5MG  ON MON, WED FRI AND SUN TAKES 5MG  ALL OTHER DAYS   Yes Historical Provider, MD   BP 137/82 mmHg  Pulse  60  Temp(Src) 97.9 F (36.6 C) (Oral)  Resp 18  Wt 183 lb 13.8 oz (83.4 kg)  SpO2 99% Physical Exam  Constitutional: He is oriented to person, place, and time. He appears well-developed and well-nourished.  HENT:  Head: Normocephalic and atraumatic.  Right Ear: External ear normal.  Left Ear: External ear normal.  Mouth/Throat: Oropharynx is clear and moist.  Eyes: Conjunctivae are normal. Pupils are equal, round, and reactive to light.  Neck: Normal range of motion. Neck supple.  Cardiovascular: Normal rate, regular rhythm, normal heart sounds and intact distal pulses.   Pulmonary/Chest: Tachypnea noted. He has rales.  Abdominal: Soft. Bowel sounds  are normal.  Musculoskeletal: Normal range of motion. He exhibits edema.  Neurological: He is alert and oriented to person, place, and time.  Skin: Skin is warm and dry.  Psychiatric: He has a normal mood and affect. His behavior is normal. Judgment and thought content normal.  Nursing note and vitals reviewed.   ED Course  Procedures (including critical care time) Labs Review Labs Reviewed  BASIC METABOLIC PANEL - Abnormal; Notable for the following:    Glucose, Bld 302 (*)    BUN 78 (*)    Creatinine, Ser 2.19 (*)    GFR calc non Af Amer 30 (*)    GFR calc Af Amer 34 (*)    All other components within normal limits  CBC - Abnormal; Notable for the following:    Hemoglobin 11.0 (*)    HCT 35.6 (*)    MCV 75.6 (*)    MCH 23.4 (*)    RDW 17.4 (*)    Platelets 143 (*)    All other components within normal limits  BRAIN NATRIURETIC PEPTIDE - Abnormal; Notable for the following:    B Natriuretic Peptide 2316.8 (*)    All other components within normal limits  PROTIME-INR  I-STAT TROPOININ, ED    Imaging Review Dg Chest 2 View  08/01/2015  CLINICAL DATA:  Chest pain and tightness. Shortness of breath. Prior myocardial infarction in June. Productive cough. EXAM: CHEST  2 VIEW COMPARISON:  None FINDINGS: Mild enlargement of the cardiopericardial silhouette with indistinct pulmonary vasculature and mild interstitial accentuation including Kerley B-lines, favoring a low-level interstitial edema. No airspace edema identified. Bifid right fifth rib. On the left side projecting over the anterior fourth rib there is a 10 mm nodular density. T12 anterior wedge compression, age indeterminate. IMPRESSION: 1. 10 mm nodular density projecting in over the left mid lung. CT chest (with contrast if feasible) is recommended to assess for malignancy. 2. Mild cardiomegaly with interstitial edema including Kerley B-lines. 3. Incidental bifid right fifth rib. Electronically Signed   By: Gaylyn Rong  M.D.   On: 08/01/2015 16:24   Ct Chest Wo Contrast  08/01/2015  CLINICAL DATA:  Chest pain and shortness of breath for 2 weeks, possible pulmonary nodule EXAM: CT CHEST WITHOUT CONTRAST TECHNIQUE: Multidetector CT imaging of the chest was performed following the standard protocol without IV contrast. COMPARISON:  08/01/2015 FINDINGS: The study is limited by respiratory motion. There is a pulmonary nodule in the left upper lobe as suspected on the chest radiograph. It measures approximately 9 mm. Measurements are in precise given the motion. Kerley B-lines noted bilaterally in the lower third lung zones. There is a small left and moderate right pleural effusion. Given limited evaluation without contrast, no significant hilar mass or adenopathy. There are prominent mediastinal lymph nodes, the largest in the precarinal region measuring 13  mm. No significant pericardial effusion. There is cardiac enlargement, mild. There is coronary artery calcification involving all 3 coronary arteries. Images through the upper abdomen demonstrate no acute findings. There is an approximately 50% compression deformity involving the anterior half of the T12 vertebral body. The endplates are involves both superiorly and inferiorly but both appear corticated. There is T11-12 and T12-L1 vacuum phenomenon in the disc. No significant retropulsion. IMPRESSION: Findings consistent with clearing pulmonary edema with bilateral pleural effusions. 9 mm left upper lobe pulmonary nodule. Consider one of the following in 3 months for both low-risk and high-risk individuals: (a) repeat chest CT, (b) follow-up PET-CT, or (c) tissue sampling. This recommendation follows the consensus statement: Guidelines for Management of Incidental Pulmonary Nodules Detected on CT Images:From the Fleischner Society 2017; published online before print (10.1148/radiol.8828003491). T12 compression deformity appears subacute to chronic. Electronically Signed   By:  Esperanza Heir M.D.   On: 08/01/2015 18:12   I have personally reviewed and evaluated these images and lab results as part of my medical decision-making.   EKG Interpretation   Date/Time:  Friday August 01 2015 15:55:43 EDT Ventricular Rate:  60 PR Interval:  166 QRS Duration: 92 QT Interval:  434 QTC Calculation: 434 R Axis:   89 Text Interpretation:  Normal sinus rhythm Nonspecific T wave abnormality  Abnormal ECG Confirmed by Nemiah Kissner MD, Cherrish Vitali ([id number]) on 08/01/2015 5:10:59  PM      MDM  PT WAS VERY SOB WHILE HE WAS LYING FLAT FOR THE CT OF HIS CHEST.  THE PT DOES HAVE PULMONARY EDEMA ON CT. I SPOKE WITH THE RADIOLOGIST ABOUT A VQ SCAN AND HE DOES NOT THINK IT WILL BE HELPFUL SINCE THE PT IS SO DYSPNEIC WHEN LYING FLAT THAT THEY WON'T BE ABLE TO GET A GOOD STUDY.   PT IS ON COUMADIN, SO I CANCELLED THE STUDY.  PT WILL BE ADM TO THE HOSPITAL.  IS POKE WITH DR. Fanny Bien AND SHE WILL ADMIT PT FOR OBSERVATION.  Final diagnoses:  SOB (shortness of breath)  Acute congestive heart failure, unspecified congestive heart failure type (HCC)  Acute renal failure, unspecified acute renal failure type (HCC)  ACS (acute coronary syndrome) (HCC)  Poorly controlled diabetes mellitus (HCC)        Jacalyn Lefevre, MD 08/01/15 2130

## 2015-08-01 NOTE — ED Notes (Signed)
Attempted to call report

## 2015-08-01 NOTE — Consult Note (Signed)
ANTICOAGULATION CONSULT NOTE - Initial Consult  Pharmacy Consult for Coumadin Indication: prescribed by cardiologist in [place], hx CAD/CHF  No Known Allergies  Patient Measurements: Height: 5\' 7"  (170.2 cm) Weight: 180 lb 12.8 oz (82.01 kg) IBW/kg (Calculated) : 66.1  Vital Signs: Temp: 98.6 F (37 C) (04/28 2044) Temp Source: Oral (04/28 2044) BP: 138/78 mmHg (04/28 2044) Pulse Rate: 56 (04/28 2044)  Labs:  Recent Labs  08/01/15 1610 08/01/15 1851  HGB 11.0*  --   HCT 35.6*  --   PLT 143*  --   LABPROT  --  43.3*  INR  --  4.76*  CREATININE 2.19*  --     Estimated Creatinine Clearance: 34 mL/min (by C-G formula based on Cr of 2.19).   Medical History: Past Medical History  Diagnosis Date  . Myocardial infarct (HCC)   . Coronary artery disease   . Diabetes mellitus without complication (HCC)   . Dyslipidemia   . Compression fracture    Assessment: 64yom on coumadin pta, being admitted with chest pain. INR on admit is above goal at 4.76. Coumadin ordered to continue but will hold tonight's dose.  Home dose: 5mg  Mon/Wed/Fri/Sun, 2.5mg  Tue/Thu/Sat - last taken 4/27  Goal of Therapy:  INR 2-3 Monitor platelets by anticoagulation protocol: Yes   Plan:  1) No coumadin tonight 2) Daily INR  Fredrik Rigger 08/01/2015,8:59 PM

## 2015-08-01 NOTE — ED Notes (Signed)
Attempted to call report x 3. 

## 2015-08-01 NOTE — ED Notes (Signed)
Attempted report x 2 

## 2015-08-01 NOTE — ED Notes (Signed)
Admitting at bedside 

## 2015-08-01 NOTE — ED Notes (Signed)
Per family, having chest pain and sob x 2 weeks, pain is to mid chest. Denies recent cough. ekg done and no resp distress noted at triage.

## 2015-08-02 DIAGNOSIS — E1122 Type 2 diabetes mellitus with diabetic chronic kidney disease: Secondary | ICD-10-CM

## 2015-08-02 DIAGNOSIS — I509 Heart failure, unspecified: Secondary | ICD-10-CM

## 2015-08-02 DIAGNOSIS — N183 Chronic kidney disease, stage 3 (moderate): Secondary | ICD-10-CM

## 2015-08-02 LAB — PROTIME-INR
INR: 4.81 — ABNORMAL HIGH (ref 0.00–1.49)
Prothrombin Time: 43.7 seconds — ABNORMAL HIGH (ref 11.6–15.2)

## 2015-08-02 LAB — GLUCOSE, CAPILLARY
GLUCOSE-CAPILLARY: 70 mg/dL (ref 65–99)
GLUCOSE-CAPILLARY: 62 mg/dL — AB (ref 65–99)
GLUCOSE-CAPILLARY: 195 mg/dL — AB (ref 65–99)
Glucose-Capillary: 93 mg/dL (ref 65–99)
Glucose-Capillary: 173 mg/dL — ABNORMAL HIGH (ref 65–99)
Glucose-Capillary: 161 mg/dL — ABNORMAL HIGH (ref 65–99)

## 2015-08-02 LAB — CBC
HEMATOCRIT: 33.4 % — AB (ref 39.0–52.0)
Hemoglobin: 10.4 g/dL — ABNORMAL LOW (ref 13.0–17.0)
MCH: 23 pg — ABNORMAL LOW (ref 26.0–34.0)
MCHC: 31.1 g/dL (ref 30.0–36.0)
MCV: 73.9 fL — AB (ref 78.0–100.0)
PLATELETS: 154 10*3/uL (ref 150–400)
RBC: 4.52 MIL/uL (ref 4.22–5.81)
RDW: 17 % — AB (ref 11.5–15.5)
WBC: 8.9 10*3/uL (ref 4.0–10.5)

## 2015-08-02 LAB — BASIC METABOLIC PANEL
Anion gap: 11 (ref 5–15)
BUN: 72 mg/dL — AB (ref 6–20)
CALCIUM: 9.2 mg/dL (ref 8.9–10.3)
CO2: 23 mmol/L (ref 22–32)
CREATININE: 2.12 mg/dL — AB (ref 0.61–1.24)
Chloride: 107 mmol/L (ref 101–111)
GFR calc Af Amer: 36 mL/min — ABNORMAL LOW (ref >60)
GFR, EST NON AFRICAN AMERICAN: 31 mL/min — AB (ref >60)
GLUCOSE: 93 mg/dL (ref 65–99)
Potassium: 3.7 mmol/L (ref 3.5–5.1)
Sodium: 141 mmol/L (ref 135–145)

## 2015-08-02 LAB — HEPATIC FUNCTION PANEL
ALK PHOS: 60 U/L (ref 38–126)
ALT: 46 U/L (ref 17–63)
AST: 32 U/L (ref 15–41)
Albumin: 3.1 g/dL — ABNORMAL LOW (ref 3.5–5.0)
BILIRUBIN INDIRECT: 0.6 mg/dL (ref 0.3–0.9)
BILIRUBIN TOTAL: 0.8 mg/dL (ref 0.3–1.2)
Bilirubin, Direct: 0.2 mg/dL (ref 0.1–0.5)
TOTAL PROTEIN: 6.3 g/dL — AB (ref 6.5–8.1)

## 2015-08-02 LAB — TROPONIN I
Troponin I: 0.05 ng/mL — ABNORMAL HIGH (ref <0.031)
Troponin I: 0.05 ng/mL — ABNORMAL HIGH (ref <0.031)

## 2015-08-02 MED ORDER — PNEUMOCOCCAL VAC POLYVALENT 25 MCG/0.5ML IJ INJ
0.5000 mL | INJECTION | INTRAMUSCULAR | Status: AC
  Administered 2015-08-03: 0.5 mL via INTRAMUSCULAR
  Filled 2015-08-02: qty 0.5

## 2015-08-02 NOTE — Progress Notes (Signed)
Patient arrived to unit 3 east bed 6 and assisted to bed by nursing staff.Patient placed on tele.No acute distress noted at present time.Patient does not speak [place] and family currently at bedside do not either.

## 2015-08-02 NOTE — Progress Notes (Signed)
2310 Received call from monitor tech patient had 8 beat run of ventricular tachycardia returning to sinus rhythm with pvc's. Upon entering patient room noticed patient with hand over chest moaning.Patient does not speak English vital signs obtained ,EKG obtained,and oxygen at 2 liters nasal cannula placed. 2320 Called interpreter line and spoke with Arline Asp code [phone] per [facility] patient denies having chest pain but complains of shortness of breath oxygen level 99 % on room air.2329 Spoke with NP T.Claiborne Billings plan to continue to monitor patient.

## 2015-08-02 NOTE — Progress Notes (Signed)
Patient blood sugar dropped 62 this morning.Hypoglycemic protocol followed and 15 grams carbs given and rechecked blood sugar up to 93.Text paged Dr. Cena Benton . Will report off to on coming nurse.

## 2015-08-02 NOTE — Consult Note (Signed)
ANTICOAGULATION CONSULT NOTE - Follow Up Consult  Pharmacy Consult for Coumadin Indication: prescribed by cardiologist in [place], hx CAD/CHF  No Known Allergies  Patient Measurements: Height: 5\' 7"  (170.2 cm) Weight: 179 lb 8 oz (81.421 kg) IBW/kg (Calculated) : 66.1  Vital Signs: Temp: 97.9 F (36.6 C) (04/29 0700) Temp Source: Oral (04/29 0700) BP: 151/91 mmHg (04/29 0700) Pulse Rate: 63 (04/29 0700)  Labs:  Recent Labs  08/01/15 1610 08/01/15 1851 08/01/15 2154 08/02/15 08/02/15 0255  HGB 11.0*  --   --   --  10.4*  HCT 35.6*  --   --   --  33.4*  PLT 143*  --   --   --  154  LABPROT  --  43.3*  --   --  43.7*  INR  --  4.76*  --   --  4.81*  CREATININE 2.19*  --   --   --  2.12*  TROPONINI  --   --  0.04* 0.05* 0.05*    Estimated Creatinine Clearance: 35 mL/min (by C-G formula based on Cr of 2.12).  Assessment: 64yom on coumadin pta, admitted with chest pain likely due to HF exacerbation. INR on admit 4.76 and dose held. INR increased further today to 4.81. LFTs wnl. CBC stable. No bleeding.  Home dose: 5mg  Mon/Wed/Fri/Sun, 2.5mg  Tue/Thu/Sat - last taken 4/27  Goal of Therapy:  INR 2-3 Monitor platelets by anticoagulation protocol: Yes   Plan:  1) No coumadin tonight 2) Daily INR  Fredrik Rigger 08/02/2015,11:25 AM

## 2015-08-02 NOTE — Progress Notes (Signed)
0200 Patient wife in hall unable to understand her.Returned to room with wife and called interpreter line . Spoke  with [employer]  code [phone] .Per [facility] patient feels blood sugar dropping .Blood sugar checked at 70 will give patient a snack and continue to monitor.

## 2015-08-02 NOTE — Progress Notes (Addendum)
PROGRESS NOTE                                                                                                                                                                                                             Patient Demographics:    Duane Martinez, is a 65 y.o. male, DOB - Nov 01, 1950, ZOX:[id number]  Admit date - 08/01/2015   Admitting Physician Michael Litter, MD  Outpatient Primary MD for the patient is No PCP Per Patient  LOS - 0  Chief Complaint  Patient presents with  . Chest Pain  . Shortness of Breath       Brief Narrative   65 y/o With history of CAD with 3 stents per my discussion with patient. He states he was placed on coumadin to keep his blood thin from the stents as he has had one stent clot off which he was told led to A heart attack in the past. He presented complaining of increased dyspnea on exertion, shortness of breath, and increased swelling in his lower extremities. His condition has improved on Lasix and he reports that the swelling has gone down.   Subjective:    Duane Martinez patient reports the swelling has gone down   Assessment  & Plan :    Principal Problem:   CHF (congestive heart failure) (HCC) - We'll continue diuresis patient's condition is improving on current regimen. - Awaiting echocardiogram  Active Problems:   CAD (coronary artery disease) - Plan will be to continue Plavix and statin    Type 2 diabetes mellitus (HCC) - Will continue current insulin regimen - We'll place on diabetic diet    CKD stage 3 due to type 2 diabetes mellitus (HCC)   Dyslipidemia - Stable continue statin    Warfarin toxicity - INR elevated we'll plan on holding Coumadin   Thrombocytopenia (HCC)    Pulmonary nodule - Patient has 9 mm left upper lobe pulmonary nodule radiologist recommends repeating tests in 3 months    Code Status : full  Family Communication  : Discussed with  patient and daughter at bedside  Disposition Plan  : Pending improvement in condition  Barriers For Discharge : Dyspnea exertion  Consults  :  Discussed with cardiology who does not recommend any further imaging studies given history  Procedures  : None  DVT Prophylaxis  :  Supratherapeutic INR  Lab Results  Component Value Date   PLT 154 08/02/2015    Antibiotics  : None  Anti-infectives    None        Objective:   Filed Vitals:   08/01/15 2044 08/01/15 2320 08/02/15 0700 08/02/15 1213  BP: 138/78 139/79 151/91 120/69  Pulse: 56  63 49  Temp: 98.6 F (37 C)  97.9 F (36.6 C) 98 F (36.7 C)  TempSrc: Oral  Oral Oral  Resp: Height:  (1.702 m)     Weight: 82.01 kg (180 lb 12.8 oz)  81.421 kg (179 lb 8 oz)   SpO2: 100% 99% 100% 100%    Wt Readings from Last 3 Encounters:  08/02/15 81.421 kg (179 lb 8 oz)     Intake/Output Summary (Last 24 hours) at 08/02/15 1512 Last data filed at 08/02/15 1300  Gross per 24 hour  Intake    920 ml  Output    200 ml  Net    720 ml     Physical Exam  Awake Alert, Oriented X 3, No new F.N deficits, Normal affect Supple Neck,No JVD, No cervical lymphadenopathy appriciated.  Symmetrical Chest wall movement, Good air movement bilaterally, CTAB RRR,No Gallops,Rubs or new Murmurs +ve B.Sounds, Abd Soft, No tenderness, No organomegaly appriciated, No rebound - guarding or rigidity. No Cyanosis, Clubbing  No new Rash or bruise  Addendum: LE edema     Data Review:    CBC  Recent Labs Lab 08/01/15 1610 08/02/15 0255  WBC 8.1 8.9  HGB 11.0* 10.4*  HCT 35.6* 33.4*  PLT 143* 154  MCV 75.6* 73.9*  MCH 23.4* 23.0*  MCHC 30.9 31.1  RDW 17.4* 17.0*    Chemistries   Recent Labs Lab 08/01/15 1610 08/02/15 0255  NA 138 141  K 4.6 3.7  CL 105 107  CO2 22 23  GLUCOSE 302* 93  BUN 78* 72*  CREATININE 2.19* 2.12*  CALCIUM 8.9 9.2  AST  --  32  ALT  --  46  ALKPHOS  --  60  BILITOT  --  0.8    ------------------------------------------------------------------------------------------------------------------ No results for input(s): CHOL, HDL, LDLCALC, TRIG, CHOLHDL, LDLDIRECT in the last 72 hours.  No results found for: HGBA1C ------------------------------------------------------------------------------------------------------------------ No results for input(s): TSH, T4TOTAL, T3FREE, THYROIDAB in the last 72 hours.  Invalid input(s): FREET3 ------------------------------------------------------------------------------------------------------------------ No results for input(s): VITAMINB12, FOLATE, FERRITIN, TIBC, IRON, RETICCTPCT in the last 72 hours.  Coagulation profile  Recent Labs Lab 08/01/15 1851 08/02/15 0255  INR 4.76* 4.81*    No results for input(s): DDIMER in the last 72 hours.  Cardiac Enzymes  Recent Labs Lab 08/01/15 2154 08/02/15 08/02/15 0255  TROPONINI 0.04* 0.05* 0.05*   ------------------------------------------------------------------------------------------------------------------    Component Value Date/Time   BNP 2316.8* 08/01/2015 1732    Inpatient Medications  Scheduled Meds: . atorvastatin  80 mg Oral q1800  . carvedilol  12.5 mg Oral Daily  . clopidogrel  75 mg Oral Daily  . furosemide  60 mg Intravenous BID  . insulin aspart  0-5 Units Subcutaneous QHS  . insulin aspart  0-9 Units Subcutaneous TID WC  . insulin NPH Human  30 Units Subcutaneous BID AC & HS  . ivabradine  7.5 mg Oral BID WC  . nitroGLYCERIN  1 inch Topical Q6H  . pantoprazole  40 mg Oral Daily  . [START ON 08/03/2015] pneumococcal 23 valent vaccine  0.5 mL Intramuscular Tomorrow-1000  . potassium chloride  10 mEq Oral BID   Continuous Infusions:  PRN Meds:.acetaminophen, morphine injection, ondansetron (ZOFRAN) IV  Micro Results No results found for this or any previous visit (from the past 240 hour(s)).  Radiology Reports Dg Chest 2 View  08/01/2015   CLINICAL DATA:  Chest pain and tightness. Shortness of breath. Prior myocardial infarction in June. Productive cough. EXAM: CHEST  2 VIEW COMPARISON:  None FINDINGS: Mild enlargement of the cardiopericardial silhouette with indistinct pulmonary vasculature and mild interstitial accentuation including Kerley B-lines, favoring a low-level interstitial edema. No airspace edema identified. Bifid right fifth rib. On the left side projecting over the anterior fourth rib there is a 10 mm nodular density. T12 anterior wedge compression, age indeterminate. IMPRESSION: 1. 10 mm nodular density projecting in over the left mid lung. CT chest (with contrast if feasible) is recommended to assess for malignancy. 2. Mild cardiomegaly with interstitial edema including Kerley B-lines. 3. Incidental bifid right fifth rib. Electronically Signed   By: Gaylyn Rong M.D.   On: 08/01/2015 16:24   Ct Chest Wo Contrast  08/01/2015  CLINICAL DATA:  Chest pain and shortness of breath for 2 weeks, possible pulmonary nodule EXAM: CT CHEST WITHOUT CONTRAST TECHNIQUE: Multidetector CT imaging of the chest was performed following the standard protocol without IV contrast. COMPARISON:  08/01/2015 FINDINGS: The study is limited by respiratory motion. There is a pulmonary nodule in the left upper lobe as suspected on the chest radiograph. It measures approximately 9 mm. Measurements are in precise given the motion. Kerley B-lines noted bilaterally in the lower third lung zones. There is a small left and moderate right pleural effusion. Given limited evaluation without contrast, no significant hilar mass or adenopathy. There are prominent mediastinal lymph nodes, the largest in the precarinal region measuring 13 mm. No significant pericardial effusion. There is cardiac enlargement, mild. There is coronary artery calcification involving all 3 coronary arteries. Images through the upper abdomen demonstrate no acute findings. There is an  approximately 50% compression deformity involving the anterior half of the T12 vertebral body. The endplates are involves both superiorly and inferiorly but both appear corticated. There is T11-12 and T12-L1 vacuum phenomenon in the disc. No significant retropulsion. IMPRESSION: Findings consistent with clearing pulmonary edema with bilateral pleural effusions. 9 mm left upper lobe pulmonary nodule. Consider one of the following in 3 months for both low-risk and high-risk individuals: (a) repeat chest CT, (b) follow-up PET-CT, or (c) tissue sampling. This recommendation follows the consensus statement: Guidelines for Management of Incidental Pulmonary Nodules Detected on CT Images:From the Fleischner Society 2017; published online before print (10.1148/radiol.8413244010). T12 compression deformity appears subacute to chronic. Electronically Signed   By: Esperanza Heir M.D.   On: 08/01/2015 18:12    Time Spent in minutes  25   Penny Pia M.D on 08/02/2015 at 3:12 PM  Between 7am to 7pm - Pager - [phone]  After 7pm go to [email or web address] - password [facility]  [facility] -  Office  [phone]

## 2015-08-02 NOTE — Progress Notes (Signed)
Called interpreter line and spoke with Asher Muir code [phone] to assist with admission history information and explain to patient what to expect during the night.

## 2015-08-03 ENCOUNTER — Inpatient Hospital Stay (HOSPITAL_COMMUNITY): Payer: Self-pay

## 2015-08-03 DIAGNOSIS — T604X4S Toxic effect of rodenticides, undetermined, sequela: Secondary | ICD-10-CM

## 2015-08-03 DIAGNOSIS — I5041 Acute combined systolic (congestive) and diastolic (congestive) heart failure: Secondary | ICD-10-CM

## 2015-08-03 DIAGNOSIS — I2510 Atherosclerotic heart disease of native coronary artery without angina pectoris: Secondary | ICD-10-CM

## 2015-08-03 LAB — PROTIME-INR
INR: 3.96 — ABNORMAL HIGH (ref 0.00–1.49)
PROTHROMBIN TIME: 37.7 s — AB (ref 11.6–15.2)

## 2015-08-03 LAB — BASIC METABOLIC PANEL
ANION GAP: 9 (ref 5–15)
BUN: 67 mg/dL — ABNORMAL HIGH (ref 6–20)
CO2: 27 mmol/L (ref 22–32)
Calcium: 9.1 mg/dL (ref 8.9–10.3)
Chloride: 106 mmol/L (ref 101–111)
Creatinine, Ser: 2.11 mg/dL — ABNORMAL HIGH (ref 0.61–1.24)
GFR calc Af Amer: 36 mL/min — ABNORMAL LOW (ref >60)
GFR, EST NON AFRICAN AMERICAN: 31 mL/min — AB (ref >60)
GLUCOSE: 149 mg/dL — AB (ref 65–99)
POTASSIUM: 4.4 mmol/L (ref 3.5–5.1)
Sodium: 142 mmol/L (ref 135–145)

## 2015-08-03 LAB — GLUCOSE, CAPILLARY
GLUCOSE-CAPILLARY: 112 mg/dL — AB (ref 65–99)
GLUCOSE-CAPILLARY: 169 mg/dL — AB (ref 65–99)
GLUCOSE-CAPILLARY: 192 mg/dL — AB (ref 65–99)
Glucose-Capillary: 78 mg/dL (ref 65–99)
Glucose-Capillary: 187 mg/dL — ABNORMAL HIGH (ref 65–99)

## 2015-08-03 LAB — ECHOCARDIOGRAM COMPLETE
HEIGHTINCHES: 67 in
Weight: 2795.2 oz

## 2015-08-03 LAB — TROPONIN I: Troponin I: 0.05 ng/mL — ABNORMAL HIGH (ref <0.031)

## 2015-08-03 NOTE — Consult Note (Signed)
ANTICOAGULATION CONSULT NOTE - Follow Up Consult  Pharmacy Consult for Coumadin Indication: prescribed by cardiologist in [place], hx CAD/CHF  No Known Allergies  Patient Measurements: Height: 5\' 7"  (170.2 cm) Weight: 174 lb 11.2 oz (79.243 kg) IBW/kg (Calculated) : 66.1  Vital Signs: Temp: 98.2 F (36.8 C) (04/30 1154) Temp Source: Oral (04/30 1154) BP: 103/59 mmHg (04/30 1154) Pulse Rate: 52 (04/30 1154)  Labs:  Recent Labs  08/01/15 1610 08/01/15 1851  08/02/15 08/02/15 0255 08/03/15 0450  HGB 11.0*  --   --   --  10.4*  --   HCT 35.6*  --   --   --  33.4*  --   PLT 143*  --   --   --  154  --   LABPROT  --  43.3*  --   --  43.7* 37.7*  INR  --  4.76*  --   --  4.81* 3.96*  CREATININE 2.19*  --   --   --  2.12* 2.11*  TROPONINI  --   --   < > 0.05* 0.05* 0.05*  < > = values in this interval not displayed.  Estimated Creatinine Clearance: 32.2 mL/min (by C-G formula based on Cr of 2.11).  Assessment: 64yom on coumadin pta, admitted with chest pain likely due to HF exacerbation. INR on admit 4.76 and dose held. INR remains supratherapeutic today at 3.96 but is beginning to trend down. LFTs wnl. Hgb 10.4, plt 154. No bleeding.  Home dose: 5mg  Mon/Wed/Fri/Sun, 2.5mg  Tue/Thu/Sat - last taken 4/27  Goal of Therapy:  INR 2-3 Monitor platelets by anticoagulation protocol: Yes   Plan:  1) No coumadin tonight 2) Daily INR  Sherle Poe, PharmD Clinical Pharmacy Resident 12:16 PM, 08/03/2015

## 2015-08-03 NOTE — Progress Notes (Signed)
PROGRESS NOTE                                                                                                                                                                                                             Patient Demographics:    Duane Martinez, is a 65 y.o. male, DOB - 09-07-1950, ZOX:[id number]  Admit date - 08/01/2015   Admitting Physician Michael Litter, MD  Outpatient Primary MD for the patient is No PCP Per Patient  LOS - 1  Chief Complaint  Patient presents with  . Chest Pain  . Shortness of Breath       Brief Narrative   65 y/o With history of CAD with 3 stents per my discussion with patient. He states he was placed on coumadin to keep his blood thin from the stents as he has had one stent clot off which he was told led to A heart attack in the past. He presented complaining of increased dyspnea on exertion, shortness of breath, and increased swelling in his lower extremities. His condition has improved on Lasix and he reports that the swelling has gone down.   Subjective:    [name] patient reports Improvement in swelling and shortness of breath   Assessment  & Plan :    Principal Problem:   CHF (congestive heart failure) (HCC) Mixed systolic and diastolic which would make it acute on chronic - We'll continue diuresis patient's condition is improving on current regimen. - Echocardiogram results available.   Active Problems:   CAD (coronary artery disease) - Plan will be to continue Plavix and statin. Patient states he was on warfarin for CAD. He would like to discontinue Coumadin if possible.    Type 2 diabetes mellitus (HCC) - Will continue current insulin regimen - We'll place on diabetic diet    CKD stage 3 due to type 2 diabetes mellitus (HCC)   Dyslipidemia - Stable continue statin    Warfarin toxicity - INR elevated we'll plan on holding Coumadin, According to history and  discussions that I have had with patient he has no true indication for Coumadin. He voices his interest in discontinuing Coumadin    Thrombocytopenia (HCC)    Pulmonary nodule - Patient has 9 mm left upper lobe pulmonary nodule radiologist recommends repeating tests in 3 months    Code Status :  full  Family Communication  : Discussed with patient and daughter at bedside  Disposition Plan  : Pending improvement in condition  Barriers For Discharge : Dyspnea exertion  Consults  :  Will consult Cardiology next am.  Procedures  : None  DVT Prophylaxis  : Supratherapeutic INR  Lab Results  Component Value Date   PLT 154 08/02/2015    Antibiotics  : None  Anti-infectives    None        Objective:   Filed Vitals:   08/02/15 2100 08/03/15 0100 08/03/15 0611 08/03/15 1154  BP: 122/71 121/61 150/79 103/59  Pulse: 55 63 51 52  Temp: 97.8 F (36.6 C) 97.9 F (36.6 C) 97.9 F (36.6 C) 98.2 F (36.8 C)  TempSrc: Oral Oral Oral Oral  Resp: 18 18 18 18   Height:      Weight:   79.243 kg (174 lb 11.2 oz)   SpO2: 100% 98% 100% 98%    Wt Readings from Last 3 Encounters:  08/03/15 79.243 kg (174 lb 11.2 oz)     Intake/Output Summary (Last 24 hours) at 08/03/15 1452 Last data filed at 08/03/15 1300  Gross per 24 hour  Intake    800 ml  Output   1350 ml  Net   -550 ml     Physical Exam  Awake Alert, Oriented X 3, No new F.N deficits, Normal affect Supple Neck,No JVD, No cervical lymphadenopathy appriciated.  Symmetrical Chest wall movement, Good air movement bilaterally, CTAB, decreased breath sounds at bases improved from yesterday RRR,No Gallops,Rubs or new Murmurs +ve B.Sounds, Abd Soft, No tenderness, No organomegaly appriciated, No rebound - guarding or rigidity. No Cyanosis, Clubbing, No new Rash or bruise, + edema at LE    Data Review:    CBC  Recent Labs Lab 08/01/15 1610 08/02/15 0255  WBC 8.1 8.9  HGB 11.0* 10.4*  HCT 35.6* 33.4*  PLT 143*  154  MCV 75.6* 73.9*  MCH 23.4* 23.0*  MCHC 30.9 31.1  RDW 17.4* 17.0*    Chemistries   Recent Labs Lab 08/01/15 1610 08/02/15 0255 08/03/15 0450  NA 138 141 142  K 4.6 3.7 4.4  CL 105 107 106  CO2 22 23 27   GLUCOSE 302* 93 149*  BUN 78* 72* 67*  CREATININE 2.19* 2.12* 2.11*  CALCIUM 8.9 9.2 9.1  AST  --  32  --   ALT  --  46  --   ALKPHOS  --  60  --   BILITOT  --  0.8  --    ------------------------------------------------------------------------------------------------------------------ No results for input(s): CHOL, HDL, LDLCALC, TRIG, CHOLHDL, LDLDIRECT in the last 72 hours.  No results found for: HGBA1C ------------------------------------------------------------------------------------------------------------------ No results for input(s): TSH, T4TOTAL, T3FREE, THYROIDAB in the last 72 hours.  Invalid input(s): FREET3 ------------------------------------------------------------------------------------------------------------------ No results for input(s): VITAMINB12, FOLATE, FERRITIN, TIBC, IRON, RETICCTPCT in the last 72 hours.  Coagulation profile  Recent Labs Lab 08/01/15 1851 08/02/15 0255 08/03/15 0450  INR 4.76* 4.81* 3.96*    No results for input(s): DDIMER in the last 72 hours.  Cardiac Enzymes  Recent Labs Lab 08/02/15 08/02/15 0255 08/03/15 0450  TROPONINI 0.05* 0.05* 0.05*   ------------------------------------------------------------------------------------------------------------------    Component Value Date/Time   BNP 2316.8* 08/01/2015 1732    Inpatient Medications  Scheduled Meds: . atorvastatin  80 mg Oral q1800  . carvedilol  12.5 mg Oral Daily  . clopidogrel  75 mg Oral Daily  . furosemide  60 mg Intravenous BID  .  insulin aspart  0-5 Units Subcutaneous QHS  . insulin aspart  0-9 Units Subcutaneous TID WC  . insulin NPH Human  30 Units Subcutaneous BID AC & HS  . ivabradine  7.5 mg Oral BID WC  . nitroGLYCERIN  1  inch Topical Q6H  . pantoprazole  40 mg Oral Daily  . potassium chloride  10 mEq Oral BID   Continuous Infusions:  PRN Meds:.acetaminophen, morphine injection, ondansetron (ZOFRAN) IV  Micro Results No results found for this or any previous visit (from the past 240 hour(s)).  Radiology Reports Dg Chest 2 View  08/01/2015  CLINICAL DATA:  Chest pain and tightness. Shortness of breath. Prior myocardial infarction in June. Productive cough. EXAM: CHEST  2 VIEW COMPARISON:  None FINDINGS: Mild enlargement of the cardiopericardial silhouette with indistinct pulmonary vasculature and mild interstitial accentuation including Kerley B-lines, favoring a low-level interstitial edema. No airspace edema identified. Bifid right fifth rib. On the left side projecting over the anterior fourth rib there is a 10 mm nodular density. T12 anterior wedge compression, age indeterminate. IMPRESSION: 1. 10 mm nodular density projecting in over the left mid lung. CT chest (with contrast if feasible) is recommended to assess for malignancy. 2. Mild cardiomegaly with interstitial edema including Kerley B-lines. 3. Incidental bifid right fifth rib. Electronically Signed   By: Gaylyn Rong M.D.   On: 08/01/2015 16:24   Ct Chest Wo Contrast  08/01/2015  CLINICAL DATA:  Chest pain and shortness of breath for 2 weeks, possible pulmonary nodule EXAM: CT CHEST WITHOUT CONTRAST TECHNIQUE: Multidetector CT imaging of the chest was performed following the standard protocol without IV contrast. COMPARISON:  08/01/2015 FINDINGS: The study is limited by respiratory motion. There is a pulmonary nodule in the left upper lobe as suspected on the chest radiograph. It measures approximately 9 mm. Measurements are in precise given the motion. Kerley B-lines noted bilaterally in the lower third lung zones. There is a small left and moderate right pleural effusion. Given limited evaluation without contrast, no significant hilar mass or  adenopathy. There are prominent mediastinal lymph nodes, the largest in the precarinal region measuring 13 mm. No significant pericardial effusion. There is cardiac enlargement, mild. There is coronary artery calcification involving all 3 coronary arteries. Images through the upper abdomen demonstrate no acute findings. There is an approximately 50% compression deformity involving the anterior half of the T12 vertebral body. The endplates are involves both superiorly and inferiorly but both appear corticated. There is T11-12 and T12-L1 vacuum phenomenon in the disc. No significant retropulsion. IMPRESSION: Findings consistent with clearing pulmonary edema with bilateral pleural effusions. 9 mm left upper lobe pulmonary nodule. Consider one of the following in 3 months for both low-risk and high-risk individuals: (a) repeat chest CT, (b) follow-up PET-CT, or (c) tissue sampling. This recommendation follows the consensus statement: Guidelines for Management of Incidental Pulmonary Nodules Detected on CT Images:From the Fleischner Society 2017; published online before print (10.1148/radiol.1610960454). T12 compression deformity appears subacute to chronic. Electronically Signed   By: Esperanza Heir M.D.   On: 08/01/2015 18:12    Time Spent in minutes  25   Penny Pia M.D on 08/03/2015 at 2:52 PM  Between 7am to 7pm - Pager - [phone]  After 7pm go to [email or web address] - password [facility]  [facility] -  Office  [phone]

## 2015-08-04 ENCOUNTER — Encounter (HOSPITAL_COMMUNITY): Payer: Self-pay | Admitting: Physician Assistant

## 2015-08-04 DIAGNOSIS — I5023 Acute on chronic systolic (congestive) heart failure: Secondary | ICD-10-CM

## 2015-08-04 LAB — BASIC METABOLIC PANEL
Anion gap: 13 (ref 5–15)
BUN: 74 mg/dL — AB (ref 6–20)
CHLORIDE: 103 mmol/L (ref 101–111)
CO2: 25 mmol/L (ref 22–32)
CREATININE: 2.33 mg/dL — AB (ref 0.61–1.24)
Calcium: 9.1 mg/dL (ref 8.9–10.3)
GFR calc Af Amer: 32 mL/min — ABNORMAL LOW (ref >60)
GFR calc non Af Amer: 27 mL/min — ABNORMAL LOW (ref >60)
Glucose, Bld: 177 mg/dL — ABNORMAL HIGH (ref 65–99)
POTASSIUM: 3.7 mmol/L (ref 3.5–5.1)
Sodium: 141 mmol/L (ref 135–145)

## 2015-08-04 LAB — CBC
HEMATOCRIT: 34.5 % — AB (ref 39.0–52.0)
Hemoglobin: 11 g/dL — ABNORMAL LOW (ref 13.0–17.0)
MCH: 23.5 pg — AB (ref 26.0–34.0)
MCHC: 31.9 g/dL (ref 30.0–36.0)
MCV: 73.7 fL — AB (ref 78.0–100.0)
PLATELETS: 135 10*3/uL — AB (ref 150–400)
RBC: 4.68 MIL/uL (ref 4.22–5.81)
RDW: 17.5 % — AB (ref 11.5–15.5)
WBC: 7.7 10*3/uL (ref 4.0–10.5)

## 2015-08-04 LAB — HEMOGLOBIN A1C
HEMOGLOBIN A1C: 9.8 % — AB (ref 4.8–5.6)
MEAN PLASMA GLUCOSE: 235 mg/dL

## 2015-08-04 LAB — GLUCOSE, CAPILLARY
Glucose-Capillary: 106 mg/dL — ABNORMAL HIGH (ref 65–99)
Glucose-Capillary: 162 mg/dL — ABNORMAL HIGH (ref 65–99)
Glucose-Capillary: 356 mg/dL — ABNORMAL HIGH (ref 65–99)
Glucose-Capillary: 421 mg/dL — ABNORMAL HIGH (ref 65–99)

## 2015-08-04 LAB — PROTIME-INR
INR: 0.69 (ref 0.00–1.49)
Prothrombin Time: 10.2 seconds — ABNORMAL LOW (ref 11.6–15.2)

## 2015-08-04 MED ORDER — HYDRALAZINE HCL 10 MG PO TABS
10.0000 mg | ORAL_TABLET | Freq: Three times a day (TID) | ORAL | Status: DC
  Administered 2015-08-04 – 2015-08-06 (×6): 10 mg via ORAL
  Filled 2015-08-04 (×6): qty 1

## 2015-08-04 MED ORDER — ASPIRIN 325 MG PO TABS
325.0000 mg | ORAL_TABLET | Freq: Every day | ORAL | Status: DC
Start: 2015-08-04 — End: 2015-08-04
  Administered 2015-08-04: 325 mg via ORAL
  Filled 2015-08-04: qty 1

## 2015-08-04 MED ORDER — INSULIN ASPART 100 UNIT/ML ~~LOC~~ SOLN
8.0000 [IU] | Freq: Once | SUBCUTANEOUS | Status: AC
  Administered 2015-08-04: 8 [IU] via SUBCUTANEOUS

## 2015-08-04 MED ORDER — ISOSORBIDE MONONITRATE ER 30 MG PO TB24
30.0000 mg | ORAL_TABLET | Freq: Every day | ORAL | Status: DC
  Administered 2015-08-04 – 2015-08-06 (×3): 30 mg via ORAL
  Filled 2015-08-04 (×3): qty 1

## 2015-08-04 MED ORDER — FUROSEMIDE 40 MG PO TABS
40.0000 mg | ORAL_TABLET | Freq: Two times a day (BID) | ORAL | Status: DC
  Administered 2015-08-04 – 2015-08-06 (×5): 40 mg via ORAL
  Filled 2015-08-04 (×5): qty 1

## 2015-08-04 MED ORDER — IVABRADINE HCL 7.5 MG PO TABS
7.5000 mg | ORAL_TABLET | Freq: Two times a day (BID) | ORAL | Status: DC
  Administered 2015-08-04 (×2): 7.5 mg via ORAL
  Filled 2015-08-04 (×2): qty 1

## 2015-08-04 NOTE — Consult Note (Signed)
CARDIOLOGY CONSULT NOTE   Patient ID: Mackenzie Valdiviez MRN: [id number] DOB/AGE: 67-May-1952 67 y.o.   Admit date: 08/01/2015  Primary Physician   No PCP Per Patient Primary Cardiologist   New  Reason for Consultation   NSVT  TDD:UKGURK Maric is a 65 y.o. year old male with a history of DM, HLD, CAD. New to [facility] so no further details available.  He was admitted 04/28 with CHF, EF 20-25%, pt also had NSVT, cards asked to evaluate.  Mr Stathis speaks only Spanish, he was interviewed with the help of his son-in-law.   He has never been told he has a weak heart, but his EF was 36% in 2015. His renal function is known to be poor, Cr 2.3 on 06/25/2015.  His respiratory status has been up/down since the heart attack last summer. However, when he came to the [place] a week ago, he was fine.   On the trip, he developed LE edema. He started getting increased DOE. The symptoms gradually worsened, until he had to come to the ER.   Since admission, he has improved, he has lost 11 lbs and is breathing much better. Some recent chest pain, not like his heart attack. He describes orthopnea and PND. He was not weighing regularly but thinks he gained about 5-6 lbs. He was not getting chest pain with ambulation. He was having some orthostatic dizziness. He has been coughing, productive of whitish sputum. No bleeding issues.   He was on Xarelto in 2015, now is on coumadin. Pt says this was because his blood was not moving through his heart well.   He never feels his heart skip or race.   Past Medical History  Diagnosis Date  . Myocardial infarct (HCC) 09/17/2014    Treated in [place], total of 5 stents now  . Coronary artery disease 2015       . Diabetes mellitus without complication (HCC)   . Dyslipidemia   . Compression fracture      Past Surgical History  Procedure Laterality Date  . Coronary angioplasty with stent placement  09/17/2014    stents to the LAD, RI, and RCA x 2    No  Known Allergies  I have reviewed the patient's current medications . aspirin  325 mg Oral Daily  . atorvastatin  80 mg Oral q1800  . carvedilol  12.5 mg Oral Daily  . clopidogrel  75 mg Oral Daily  . furosemide  40 mg Oral BID  . insulin aspart  0-5 Units Subcutaneous QHS  . insulin aspart  0-9 Units Subcutaneous TID WC  . insulin NPH Human  30 Units Subcutaneous BID AC & HS  . ivabradine  7.5 mg Oral BID WC  . nitroGLYCERIN  1 inch Topical Q6H  . pantoprazole  40 mg Oral Daily  . potassium chloride  10 mEq Oral BID     acetaminophen, morphine injection, ondansetron (ZOFRAN) IV  Prior to Admission medications   Medication Sig Start Date End Date Taking? Authorizing Provider  acetaminophen (TYLENOL) 500 MG tablet Take 500 mg by mouth every 8 (eight) hours as needed for moderate pain.   Yes Historical Provider, MD  atorvastatin (LIPITOR) 80 MG tablet Take 80 mg by mouth daily at 6 PM.   Yes Historical Provider, MD  carvedilol (COREG) 12.5 MG tablet Take 12.5 mg by mouth daily.    Yes Historical Provider, MD  clopidogrel (PLAVIX) 75 MG tablet Take 75 mg by mouth 2 (two) times  daily.   Yes Historical Provider, MD  furosemide (LASIX) 40 MG tablet Take 40 mg by mouth 2 (two) times daily.    Yes Historical Provider, MD  insulin NPH Human (HUMULIN N,NOVOLIN N) 100 UNIT/ML injection Inject 30 Units into the skin 2 (two) times daily before a meal.   Yes Historical Provider, MD  ivabradine (CORLANOR) 7.5 MG TABS tablet Take 7.5 mg by mouth 2 (two) times daily with a meal.   Yes Historical Provider, MD  lansoprazole (PREVACID) 30 MG capsule Take 30 mg by mouth daily at 12 noon.   Yes Historical Provider, MD  Multiple Vitamin (MULTIVITAMIN WITH MINERALS) TABS tablet Take 1 tablet by mouth 2 (two) times daily.   Yes Historical Provider, MD  warfarin (COUMADIN) 5 MG tablet Take 5 mg by mouth daily at 6 PM. 2.5MG  ON MON, WED FRI AND SUN TAKES  ALL OTHER DAYS   Yes Historical Provider, MD      Social History   Social History  . Marital Status: Married    Spouse Name: N/A  . Number of Children: N/A  . Years of Education: N/A   Occupational History  . Lawyer and judge    Social History Main Topics  . Smoking status: Former Smoker    Quit date: 04/05/1985  . Smokeless tobacco: Not on file  . Alcohol Use: No  . Drug Use: No  . Sexual Activity: Not on file   Other Topics Concern  . Not on file   Social History Narrative   Pt and wife visiting their daughter, son-in-law and granddaughter in [place].    Family Status  Relation Status Death Age  . Mother Deceased    Family History  Problem Relation Age of Onset  . Heart attack Mother      ROS:  Full 14 point review of systems complete and found to be negative unless listed above.  Physical Exam: Blood pressure 117/66, pulse 52, temperature 98 F (36.7 C), temperature source Oral, resp. rate 18, height  (1.702 m), weight 172 lb 1.6 oz (78.064 kg), SpO2 100 %.  General: Well developed, well nourished, male in no acute distress Head: Eyes PERRLA, No xanthomas.   Normocephalic and atraumatic, oropharynx without edema or exudate. Dentition: good Lungs: decreased BS bases w/ few rales Heart: HRRR S1 S2, no rub/gallop, no sig murmur. pulses are 2+ all 4 extrem.   Neck: No carotid bruits. No lymphadenopathy.  JVD not elevated. No sig HJR Abdomen: Bowel sounds present, abdomen soft and non-tender without masses or hernias noted. Msk:  No spine or cva tenderness. No weakness, no joint deformities or effusions. Extremities: No clubbing or cyanosis.  edema.  Neuro: Alert and oriented X 3. No focal deficits noted. Psych:  Good affect, responds appropriately Skin: No rashes or lesions noted.  Labs:   Lab Results  Component Value Date   WBC 7.7 08/04/2015   HGB 11.0* 08/04/2015   HCT 34.5* 08/04/2015   MCV 73.7* 08/04/2015   PLT 135* 08/04/2015    Recent Labs  08/04/15 0233  INR 0.69    Recent  Labs Lab 08/02/15 0255  08/04/15 0233  NA 141  < > 141  K 3.7  < > 3.7  CL 107  < > 103  CO2 23  < > 25  BUN 72*  < > 74*  CREATININE 2.12*  < > 2.33*  CALCIUM 9.2  < > 9.1  PROT 6.3*  --   --   BILITOT  0.8  --   --   ALKPHOS 60  --   --   ALT 46  --   --   AST 32  --   --   GLUCOSE 93  < > 177*  ALBUMIN 3.1*  --   --   < > = values in this interval not displayed. No results found for: MG  Recent Labs  08/01/15 2154 08/02/15 08/02/15 0255 08/03/15 0450  TROPONINI 0.04* 0.05* 0.05* 0.05*   B NATRIURETIC PEPTIDE  Date/Time Value Ref Range Status  08/01/2015 05:32 PM 2316.8* 0.0 - 100.0 pg/mL Final   Echo: 08/03/2015 - Left ventricle: The cavity size was normal. Systolic function was  severely reduced. The estimated ejection fraction was in the  range of 20% to 25%. Severe diffuse hypokinesis. Akinesis of the  apical myocardium. Doppler parameters are consistent with an  irreversible restrictive pattern, indicative of decreased left  ventricular diastolic compliance and/or increased left atrial  pressure (grade 4 diastolic dysfunction). - Mitral valve: There was mild regurgitation. - Left atrium: The atrium was moderately to severely dilated. - Tricuspid valve: There was moderate-severe regurgitation. - Pulmonary arteries: Systolic pressure was severely increased. PA  peak pressure: 94 mm Hg (S).  ECG:  08/02/2015 SR, rate 65, no acute ischemic changes  Radiology:  Dg Chest 2 View 08/01/2015  CLINICAL DATA:  Chest pain and tightness. Shortness of breath. Prior myocardial infarction in June. Productive cough. EXAM: CHEST  2 VIEW COMPARISON:  None FINDINGS: Mild enlargement of the cardiopericardial silhouette with indistinct pulmonary vasculature and mild interstitial accentuation including Kerley B-lines, favoring a low-level interstitial edema. No airspace edema identified. Bifid right fifth rib. On the left side projecting over the anterior fourth rib there is a  10 mm nodular density. T12 anterior wedge compression, age indeterminate. IMPRESSION: 1. 10 mm nodular density projecting in over the left mid lung. CT chest (with contrast if feasible) is recommended to assess for malignancy. 2. Mild cardiomegaly with interstitial edema including Kerley B-lines. 3. Incidental bifid right fifth rib. Electronically Signed   By: Gaylyn Rong M.D.   On: 08/01/2015 16:24   Ct Chest Wo Contrast 08/01/2015  CLINICAL DATA:  Chest pain and shortness of breath for 2 weeks, possible pulmonary nodule EXAM: CT CHEST WITHOUT CONTRAST TECHNIQUE: Multidetector CT imaging of the chest was performed following the standard protocol without IV contrast. COMPARISON:  08/01/2015 FINDINGS: The study is limited by respiratory motion. There is a pulmonary nodule in the left upper lobe as suspected on the chest radiograph. It measures approximately 9 mm. Measurements are in precise given the motion. Kerley B-lines noted bilaterally in the lower third lung zones. There is a small left and moderate right pleural effusion. Given limited evaluation without contrast, no significant hilar mass or adenopathy. There are prominent mediastinal lymph nodes, the largest in the precarinal region measuring 13 mm. No significant pericardial effusion. There is cardiac enlargement, mild. There is coronary artery calcification involving all 3 coronary arteries. Images through the upper abdomen demonstrate no acute findings. There is an approximately 50% compression deformity involving the anterior half of the T12 vertebral body. The endplates are involves both superiorly and inferiorly but both appear corticated. There is T11-12 and T12-L1 vacuum phenomenon in the disc. No significant retropulsion. IMPRESSION: Findings consistent with clearing pulmonary edema with bilateral pleural effusions. 9 mm left upper lobe pulmonary nodule. Consider one of the following in 3 months for both low-risk and high-risk individuals:  (a) repeat chest CT, (  b) follow-up PET-CT, or (c) tissue sampling. This recommendation follows the consensus statement: Guidelines for Management of Incidental Pulmonary Nodules Detected on CT Images:From the Fleischner Society 2017; published online before print (10.1148/radiol.1610960454). T12 compression deformity appears subacute to chronic. Electronically Signed   By: Esperanza Heir M.D.   On: 08/01/2015 18:12     ASSESSMENT AND PLAN:   The patient was seen today by Dr Jens Som, the patient evaluated and the data reviewed.  Principal Problem:   CHF (congestive heart failure) (HCC) - acute on chronic combined CHF - continue PO Lasix - his PAS is very high, he needs hydralazine and nitrates - change NTG paste to Imdur 30 - add hydralazine 10 mg tid - early f/u with his primary cardiologist is essential.     Bradycardia - d/c Corlanor, continue Coreg    Ischemic CM - EF now 20-25% - by previous echo 08/2014 (translated by Ashby Dawes), his EF was 56%, RV and LV systolic and diastolic function were normal  Active Problems:   CAD (coronary artery disease) - d/c ASA, continue Plavix - continue BB, high-dose statin - no ACE/ARB due to poor renal function  Otherwise, per IM, ok to resume coumadin   Type 2 diabetes mellitus (HCC)   CKD stage 3 due to type 2 diabetes mellitus (HCC)   Dyslipidemia   Warfarin toxicity   Thrombocytopenia (HCC)   Pulmonary nodule   Signed: Theodore Demark, PA-C 08/04/2015 4:21 PM Beeper [phone]  Co-Sign MD As above, patient seen and examined. Briefly he is a 65 year old male from [place] with past medical history of diabetes mellitus, renal insufficiency, Ischemic cardiomyopathy, hyperlipidemia for evaluation of acute on chronic systolic congestive heart failure. The patient has had multiple previous stents; previous ejection fraction was 35% but then improved to 56%. Since his last myocardial infarction he has had Intermittent dyspnea on exertion and  fatigue. He is visiting family here and plans to return to the [place] in 2 weeks. He developed progressive dyspnea on exertion orthopnea, ankle edema and weight gain of 5 pounds. He has been diuresed with some improvement.He has had no chest pain, palpitations or syncope. He was noted to have 6 beats nonsustained ventricular tachycardia on telemetry. He does not know why he's on Coumadin. 1 coronary artery disease- continue Plavix and statin. Discontinue aspirin as he is on Coumadin long-term. 2 acute on chronic systolic congestive heart failure-much improved. Continue present dose of Lasix. 3 ischemic cardiomyopathy-there appears to be some discrepancy in LV function assessment previously. Echocardiogram now shows ejection fraction 20-25%. Continue beta blocker. He is somewhat bradycardic. Discontinue corlanor. We cannot use an ACE inhibitor because of baseline renal insufficiency. Add hydralazine/nitrates. If LV function is truly newly decreased he would need ischemia evaluation. However he would be high risk for contrast nephropathy and possible dialysis if catheterization needed. We may proceed with nuclear studyfor risk stratification. When he returns to the [place] he will need reassessment of LV function after medication titration and if ejection fraction less than 35% would need to be considered for ICD. Resume Coumadin at discharge and follow-up with his cardiologist for further management. 4 chronic stage IV renal disease-follow renal function closely with diuresis. Olga Millers

## 2015-08-04 NOTE — Progress Notes (Signed)
Pt's blood sugar was 421 earlier informed MD and given 8 units Novolog, around 11.58, novolog 4 units given for BS 314. Pt also got NPH 30 units, will continue to monitor the patient.

## 2015-08-04 NOTE — Progress Notes (Signed)
PROGRESS NOTE                                                                                                                                                                                                             Patient Demographics:    Duane Martinez, is a 65 y.o. male, DOB - 09-19-1950, ZOX:[id number]  Admit date - 08/01/2015   Admitting Physician Michael Litter, MD  Outpatient Primary MD for the patient is No PCP Per Patient  LOS - 2  Chief Complaint  Patient presents with  . Chest Pain  . Shortness of Breath       Brief Narrative   65 y/o With history of CAD with 3 stents per my discussion with patient. He states he was placed on coumadin to keep his blood thin from the stents as he has had one stent clot off which he was told led to A heart attack in the past. He presented complaining of increased dyspnea on exertion, shortness of breath, and increased swelling in his lower extremities. His condition has improved on Lasix and he reports that the swelling has gone down. Upon review of his telemetry strips patient had 8 beats of v tach in lieu of EF of 20-25% decided to consult cardiology given complicated cardiac history.   Subjective:    Duane Martinez He states his sob is improved as well as his swelling.   Assessment  & Plan :    Principal Problem:   CHF (congestive heart failure) (HCC) Mixed systolic and diastolic which would make it acute on chronic - We'll continue diuresis patient's condition is improving on current regimen. - Echocardiogram results and showing EF of 20-25% - resolved and patient back on home lasix regimen  Non sustained V tach - given EF of 20-25% will consult cardiology  Active Problems:   CAD (coronary artery disease) - Plan will be to continue Plavix and statin. Patient states he was on warfarin for CAD. He would like to discontinue Coumadin if possible.    Type 2 diabetes  mellitus (HCC) - Will continue current insulin regimen - We'll place on diabetic diet    CKD stage 3 due to type 2 diabetes mellitus (HCC)   Dyslipidemia - Stable continue statin    Warfarin toxicity - resolved patient off coumadin per his preference.  Thrombocytopenia (HCC)    Pulmonary nodule - Patient has 9 mm left upper lobe pulmonary nodule radiologist recommends repeating tests in 3 months    Code Status : full  Family Communication  : Discussed with patient and daughter at bedside  Disposition Plan  : Pending improvement in condition  Barriers For Discharge : Dyspnea exertion  Consults  :  Cardiology  Procedures  : None  DVT Prophylaxis  : scd's   Lab Results  Component Value Date   PLT 135* 08/04/2015    Antibiotics  : None  Anti-infectives    None        Objective:   Filed Vitals:   08/03/15 1154 08/03/15 2254 08/04/15 0621 08/04/15 1210  BP: 103/59 123/60 104/52 117/66  Pulse: 52 52 54 52  Temp: 98.2 F (36.8 C) 97.9 F (36.6 C) 97.4 F (36.3 C) 98 F (36.7 C)  TempSrc: Oral Oral Oral Oral  Resp: 18 18 16 18   Height:      Weight:   78.064 kg (172 lb 1.6 oz)   SpO2: 98% 100% 100% 100%    Wt Readings from Last 3 Encounters:  08/04/15 78.064 kg (172 lb 1.6 oz)     Intake/Output Summary (Last 24 hours) at 08/04/15 1435 Last data filed at 08/04/15 1058  Gross per 24 hour  Intake    680 ml  Output   2475 ml  Net  -1795 ml     Physical Exam  Awake Alert, Oriented X 3, No new F.N deficits, Normal affect Supple Neck,No JVD, No cervical lymphadenopathy appriciated.  Symmetrical Chest wall movement, Good air movement bilaterally, CTAB RRR,No Gallops,Rubs or new Murmurs +ve B.Sounds, Abd Soft, No tenderness, No organomegaly appriciated, No rebound - guarding or rigidity. No Cyanosis, Clubbing, No new Rash or bruise, + edema at LE    Data Review:    CBC  Recent Labs Lab 08/01/15 1610 08/02/15 0255 08/04/15 0233  WBC 8.1  8.9 7.7  HGB 11.0* 10.4* 11.0*  HCT 35.6* 33.4* 34.5*  PLT 143* 154 135*  MCV 75.6* 73.9* 73.7*  MCH 23.4* 23.0* 23.5*  MCHC 30.9 31.1 31.9  RDW 17.4* 17.0* 17.5*    Chemistries   Recent Labs Lab 08/01/15 1610 08/02/15 0255 08/03/15 0450 08/04/15 0233  NA 138 141 142 141  K 4.6 3.7 4.4 3.7  CL 105 107 106 103  CO2 22 23 27 25   GLUCOSE 302* 93 149* 177*  BUN 78* 72* 67* 74*  CREATININE 2.19* 2.12* 2.11* 2.33*  CALCIUM 8.9 9.2 9.1 9.1  AST  --  32  --   --   ALT  --  46  --   --   ALKPHOS  --  60  --   --   BILITOT  --  0.8  --   --    ------------------------------------------------------------------------------------------------------------------ No results for input(s): CHOL, HDL, LDLCALC, TRIG, CHOLHDL, LDLDIRECT in the last 72 hours.  No results found for: HGBA1C ------------------------------------------------------------------------------------------------------------------ No results for input(s): TSH, T4TOTAL, T3FREE, THYROIDAB in the last 72 hours.  Invalid input(s): FREET3 ------------------------------------------------------------------------------------------------------------------ No results for input(s): VITAMINB12, FOLATE, FERRITIN, TIBC, IRON, RETICCTPCT in the last 72 hours.  Coagulation profile  Recent Labs Lab 08/01/15 1851 08/02/15 0255 08/03/15 0450 08/04/15 0233  INR 4.76* 4.81* 3.96* 0.69    No results for input(s): DDIMER in the last 72 hours.  Cardiac Enzymes  Recent Labs Lab 08/02/15 08/02/15 0255 08/03/15 0450  TROPONINI 0.05* 0.05* 0.05*   ------------------------------------------------------------------------------------------------------------------  Component Value Date/Time   BNP 2316.8* 08/01/2015 1732    Inpatient Medications  Scheduled Meds: . aspirin  325 mg Oral Daily  . atorvastatin  80 mg Oral q1800  . carvedilol  12.5 mg Oral Daily  . clopidogrel  75 mg Oral Daily  . furosemide  40 mg Oral BID  .  insulin aspart  0-5 Units Subcutaneous QHS  . insulin aspart  0-9 Units Subcutaneous TID WC  . insulin NPH Human  30 Units Subcutaneous BID AC & HS  . ivabradine  7.5 mg Oral BID WC  . nitroGLYCERIN  1 inch Topical Q6H  . pantoprazole  40 mg Oral Daily  . potassium chloride  10 mEq Oral BID   Continuous Infusions:  PRN Meds:.acetaminophen, morphine injection, ondansetron (ZOFRAN) IV  Micro Results No results found for this or any previous visit (from the past 240 hour(s)).  Radiology Reports Dg Chest 2 View  08/01/2015  CLINICAL DATA:  Chest pain and tightness. Shortness of breath. Prior myocardial infarction in June. Productive cough. EXAM: CHEST  2 VIEW COMPARISON:  None FINDINGS: Mild enlargement of the cardiopericardial silhouette with indistinct pulmonary vasculature and mild interstitial accentuation including Kerley B-lines, favoring a low-level interstitial edema. No airspace edema identified. Bifid right fifth rib. On the left side projecting over the anterior fourth rib there is a 10 mm nodular density. T12 anterior wedge compression, age indeterminate. IMPRESSION: 1. 10 mm nodular density projecting in over the left mid lung. CT chest (with contrast if feasible) is recommended to assess for malignancy. 2. Mild cardiomegaly with interstitial edema including Kerley B-lines. 3. Incidental bifid right fifth rib. Electronically Signed   By: Gaylyn Rong M.D.   On: 08/01/2015 16:24   Ct Chest Wo Contrast  08/01/2015  CLINICAL DATA:  Chest pain and shortness of breath for 2 weeks, possible pulmonary nodule EXAM: CT CHEST WITHOUT CONTRAST TECHNIQUE: Multidetector CT imaging of the chest was performed following the standard protocol without IV contrast. COMPARISON:  08/01/2015 FINDINGS: The study is limited by respiratory motion. There is a pulmonary nodule in the left upper lobe as suspected on the chest radiograph. It measures approximately 9 mm. Measurements are in precise given the  motion. Kerley B-lines noted bilaterally in the lower third lung zones. There is a small left and moderate right pleural effusion. Given limited evaluation without contrast, no significant hilar mass or adenopathy. There are prominent mediastinal lymph nodes, the largest in the precarinal region measuring 13 mm. No significant pericardial effusion. There is cardiac enlargement, mild. There is coronary artery calcification involving all 3 coronary arteries. Images through the upper abdomen demonstrate no acute findings. There is an approximately 50% compression deformity involving the anterior half of the T12 vertebral body. The endplates are involves both superiorly and inferiorly but both appear corticated. There is T11-12 and T12-L1 vacuum phenomenon in the disc. No significant retropulsion. IMPRESSION: Findings consistent with clearing pulmonary edema with bilateral pleural effusions. 9 mm left upper lobe pulmonary nodule. Consider one of the following in 3 months for both low-risk and high-risk individuals: (a) repeat chest CT, (b) follow-up PET-CT, or (c) tissue sampling. This recommendation follows the consensus statement: Guidelines for Management of Incidental Pulmonary Nodules Detected on CT Images:From the Fleischner Society 2017; published online before print (10.1148/radiol.1610960454). T12 compression deformity appears subacute to chronic. Electronically Signed   By: Esperanza Heir M.D.   On: 08/01/2015 18:12    Time Spent in minutes  25   Penny Pia M.D on  08/04/2015 at 2:35 PM  Between 7am to 7pm - Pager - [phone]  After 7pm go to [email or web address] - password [facility]  [facility] -  Office  [phone]

## 2015-08-05 DIAGNOSIS — I5021 Acute systolic (congestive) heart failure: Secondary | ICD-10-CM

## 2015-08-05 DIAGNOSIS — I5040 Unspecified combined systolic (congestive) and diastolic (congestive) heart failure: Secondary | ICD-10-CM

## 2015-08-05 LAB — PROTIME-INR
INR: 2.59 — ABNORMAL HIGH (ref 0.00–1.49)
PROTHROMBIN TIME: 27.4 s — AB (ref 11.6–15.2)

## 2015-08-05 LAB — GLUCOSE, CAPILLARY
GLUCOSE-CAPILLARY: 108 mg/dL — AB (ref 65–99)
GLUCOSE-CAPILLARY: 154 mg/dL — AB (ref 65–99)
Glucose-Capillary: 304 mg/dL — ABNORMAL HIGH (ref 65–99)
Glucose-Capillary: 242 mg/dL — ABNORMAL HIGH (ref 65–99)
Glucose-Capillary: 254 mg/dL — ABNORMAL HIGH (ref 65–99)

## 2015-08-05 MED ORDER — WARFARIN SODIUM 2.5 MG PO TABS
2.5000 mg | ORAL_TABLET | Freq: Once | ORAL | Status: AC
  Administered 2015-08-05: 2.5 mg via ORAL
  Filled 2015-08-05: qty 1

## 2015-08-05 MED ORDER — ISOSORBIDE MONONITRATE ER 30 MG PO TB24: 30.0000 mg | ORAL_TABLET | Freq: Every day | ORAL | Status: AC

## 2015-08-05 MED ORDER — WARFARIN - PHARMACIST DOSING INPATIENT: Freq: Every day | Status: DC

## 2015-08-05 NOTE — Progress Notes (Signed)
PROGRESS NOTE                                                                                                                                                                                                             Patient Demographics:    Duane Martinez, is a 65 y.o. male, DOB - Jul 13, 1950, KAJ:[id number]  Admit date - 08/01/2015   Admitting Physician Duane Litter, MD  Outpatient Primary MD for the patient is No PCP Per Patient  LOS - 3  Chief Complaint  Patient presents with  . Chest Pain  . Shortness of Breath       Brief Narrative   65 y/o With history of CAD with 3 stents per my discussion with patient. He states he was placed on coumadin to keep his blood thin from the stents as he has had one stent clot off which he was told led to A heart attack in the past. He presented complaining of increased dyspnea on exertion, shortness of breath, and increased swelling in his lower extremities. His condition has improved on Lasix and he reports that the swelling has gone down. Upon review of his telemetry strips patient had 8 beats of v tach in lieu of EF of 20-25% decided to consult cardiology given complicated cardiac history.   Subjective:    Duane Martinez He has no new complaints today.   Assessment  & Plan :    Principal Problem:   CHF (congestive heart failure) (HCC) Mixed systolic and diastolic which would make it acute on chronic - We'll continue diuresis patient's condition is improving on current regimen. - Echocardiogram results and showing EF of 20-25% - resolved and patient back on home lasix regimen  Non sustained V tach - given EF of 20-25% consulted cardiology - discussed care moving forward with cardiology and given that [place] 2 [place] plan will be to obtain a stress nuclear study tomorrow 5/3  Active Problems:   CAD (coronary artery disease) - Plavix  currently and will continue warfarin per cardiology recommendations.    Type 2 diabetes mellitus (HCC) - Will continue current insulin regimen - We'll place on diabetic diet    CKD stage 3 due to type 2 diabetes mellitus (HCC)   Dyslipidemia - Stable continue statin    Warfarin toxicity -  resolved     Thrombocytopenia (HCC)    Pulmonary nodule - Patient has 9 mm left upper lobe pulmonary nodule radiologist recommends repeating tests in 3 months    Code Status : full  Family Communication  : Discussed with patient and daughter at bedside  Disposition Plan  : Pending improvement in condition  Barriers For Discharge : Dyspnea exertion  Consults  :  Cardiology  Procedures  : None  DVT Prophylaxis  : scd's   Lab Results  Component Value Date   PLT 135* 08/04/2015    Antibiotics  : None  Anti-infectives    None        Objective:   Filed Vitals:   08/04/15 1210 08/04/15 2215 08/05/15 0633 08/05/15 1216  BP: 117/66 123/71 115/64 113/62  Pulse: 52 52 55 49  Temp: 98 F (36.7 C) 97.9 F (36.6 C) 98 F (36.7 C) 97.7 F (36.5 C)  TempSrc: Oral Oral Oral Oral  Resp: 18 18 17 18   Height:      Weight:   78.472 kg (173 lb)   SpO2: 100% 100% 98% 100%    Wt Readings from Last 3 Encounters:  08/05/15 78.472 kg (173 lb)     Intake/Output Summary (Last 24 hours) at 08/05/15 1302 Last data filed at 08/05/15 0423  Gross per 24 hour  Intake    320 ml  Output    420 ml  Net   -100 ml     Physical Exam  Awake Alert, Oriented X 3, No new F.N deficits, Normal affect Supple Neck,No JVD, No cervical lymphadenopathy appriciated.  Symmetrical Chest wall movement, Good air movement bilaterally, CTAB RRR,No Gallops,Rubs or new Murmurs +ve B.Sounds, Abd Soft, No tenderness, No organomegaly appriciated, No rebound - guarding or rigidity. No Cyanosis, Clubbing, No new Rash or bruise, + edema at LE    Data Review:    CBC  Recent Labs Lab 08/01/15 1610  08/02/15 0255 08/04/15 0233  WBC 8.1 8.9 7.7  HGB 11.0* 10.4* 11.0*  HCT 35.6* 33.4* 34.5*  PLT 143* 154 135*  MCV 75.6* 73.9* 73.7*  MCH 23.4* 23.0* 23.5*  MCHC 30.9 31.1 31.9  RDW 17.4* 17.0* 17.5*    Chemistries   Recent Labs Lab 08/01/15 1610 08/02/15 0255 08/03/15 0450 08/04/15 0233  NA 138 141 142 141  K 4.6 3.7 4.4 3.7  CL 105 107 106 103  CO2 22 23 27 25   GLUCOSE 302* 93 149* 177*  BUN 78* 72* 67* 74*  CREATININE 2.19* 2.12* 2.11* 2.33*  CALCIUM 8.9 9.2 9.1 9.1  AST  --  32  --   --   ALT  --  46  --   --   ALKPHOS  --  60  --   --   BILITOT  --  0.8  --   --    ------------------------------------------------------------------------------------------------------------------ No results for input(s): CHOL, HDL, LDLCALC, TRIG, CHOLHDL, LDLDIRECT in the last 72 hours.  Lab Results  Component Value Date   HGBA1C 9.8* 08/01/2015   ------------------------------------------------------------------------------------------------------------------ No results for input(s): TSH, T4TOTAL, T3FREE, THYROIDAB in the last 72 hours.  Invalid input(s): FREET3 ------------------------------------------------------------------------------------------------------------------ No results for input(s): VITAMINB12, FOLATE, FERRITIN, TIBC, IRON, RETICCTPCT in the last 72 hours.  Coagulation profile  Recent Labs Lab 08/01/15 1851 08/02/15 0255 08/03/15 0450 08/04/15 0233 08/05/15 0220  INR 4.76* 4.81* 3.96* 0.69 2.59*    No results for input(s): DDIMER in the last 72 hours.  Cardiac Enzymes  Recent Labs Lab  08/02/15 08/02/15 0255 08/03/15 0450  TROPONINI 0.05* 0.05* 0.05*   ------------------------------------------------------------------------------------------------------------------    Component Value Date/Time   BNP 2316.8* 08/01/2015 1732    Inpatient Medications  Scheduled Meds: . atorvastatin  80 mg Oral q1800  . carvedilol  12.5 mg Oral Daily  .  clopidogrel  75 mg Oral Daily  . furosemide  40 mg Oral BID  . hydrALAZINE  10 mg Oral Q8H  . insulin aspart  0-5 Units Subcutaneous QHS  . insulin aspart  0-9 Units Subcutaneous TID WC  . insulin NPH Human  30 Units Subcutaneous BID AC & HS  . isosorbide mononitrate  30 mg Oral Daily  . pantoprazole  40 mg Oral Daily  . potassium chloride  10 mEq Oral BID   Continuous Infusions:  PRN Meds:.acetaminophen, morphine injection, ondansetron (ZOFRAN) IV  Micro Results No results found for this or any previous visit (from the past 240 hour(s)).  Radiology Reports Dg Chest 2 View  08/01/2015  CLINICAL DATA:  Chest pain and tightness. Shortness of breath. Prior myocardial infarction in June. Productive cough. EXAM: CHEST  2 VIEW COMPARISON:  None FINDINGS: Mild enlargement of the cardiopericardial silhouette with indistinct pulmonary vasculature and mild interstitial accentuation including Kerley B-lines, favoring a low-level interstitial edema. No airspace edema identified. Bifid right fifth rib. On the left side projecting over the anterior fourth rib there is a 10 mm nodular density. T12 anterior wedge compression, age indeterminate. IMPRESSION: 1. 10 mm nodular density projecting in over the left mid lung. CT chest (with contrast if feasible) is recommended to assess for malignancy. 2. Mild cardiomegaly with interstitial edema including Kerley B-lines. 3. Incidental bifid right fifth rib. Electronically Signed   By: Gaylyn Rong M.D.   On: 08/01/2015 16:24   Ct Chest Wo Contrast  08/01/2015  CLINICAL DATA:  Chest pain and shortness of breath for 2 weeks, possible pulmonary nodule EXAM: CT CHEST WITHOUT CONTRAST TECHNIQUE: Multidetector CT imaging of the chest was performed following the standard protocol without IV contrast. COMPARISON:  08/01/2015 FINDINGS: The study is limited by respiratory motion. There is a pulmonary nodule in the left upper lobe as suspected on the chest radiograph. It  measures approximately 9 mm. Measurements are in precise given the motion. Kerley B-lines noted bilaterally in the lower third lung zones. There is a small left and moderate right pleural effusion. Given limited evaluation without contrast, no significant hilar mass or adenopathy. There are prominent mediastinal lymph nodes, the largest in the precarinal region measuring 13 mm. No significant pericardial effusion. There is cardiac enlargement, mild. There is coronary artery calcification involving all 3 coronary arteries. Images through the upper abdomen demonstrate no acute findings. There is an approximately 50% compression deformity involving the anterior half of the T12 vertebral body. The endplates are involves both superiorly and inferiorly but both appear corticated. There is T11-12 and T12-L1 vacuum phenomenon in the disc. No significant retropulsion. IMPRESSION: Findings consistent with clearing pulmonary edema with bilateral pleural effusions. 9 mm left upper lobe pulmonary nodule. Consider one of the following in 3 months for both low-risk and high-risk individuals: (a) repeat chest CT, (b) follow-up PET-CT, or (c) tissue sampling. This recommendation follows the consensus statement: Guidelines for Management of Incidental Pulmonary Nodules Detected on CT Images:From the Fleischner Society 2017; published online before print (10.1148/radiol.1914782956). T12 compression deformity appears subacute to chronic. Electronically Signed   By: Esperanza Heir M.D.   On: 08/01/2015 18:12    Time Spent in minutes  25   Penny Pia M.D on 08/05/2015 at 1:02 PM  Between 7am to 7pm - Pager - [phone]  After 7pm go to [email or web address] - password [facility]  [facility] -  Office  [phone]

## 2015-08-05 NOTE — Progress Notes (Signed)
Interpreter Wyvonnia Dusky for Cardiology PA

## 2015-08-05 NOTE — Consult Note (Signed)
ANTICOAGULATION CONSULT NOTE - Follow Up Consult  Pharmacy Consult for Coumadin Indication: prescribed by cardiologist in [place], hx CAD/CHF  No Known Allergies  Patient Measurements: Height: 5\' 7"  (170.2 cm) Weight: 173 lb (78.472 kg) (a scale) IBW/kg (Calculated) : 66.1  Vital Signs: Temp: 97.7 F (36.5 C) (05/02 1216) Temp Source: Oral (05/02 1216) BP: 113/62 mmHg (05/02 1216) Pulse Rate: 49 (05/02 1216)  Labs:  Recent Labs  08/03/15 0450 08/04/15 0233 08/05/15 0220  HGB  --  11.0*  --   HCT  --  34.5*  --   PLT  --  135*  --   LABPROT 37.7* 10.2* 27.4*  INR 3.96* 0.69 2.59*  CREATININE 2.11* 2.33*  --   TROPONINI 0.05*  --   --     Estimated Creatinine Clearance: 29.2 mL/min (by C-G formula based on Cr of 2.33).  Assessment: 64yom on coumadin pta, admitted with chest pain likely due to HF exacerbation. INR on admit 4.76 and dose held.  INR now back within therapeutic range  Home dose: 5mg  Mon/Wed/Fri/Sun, 2.5mg  Tue/Thu/Sat - last taken 4/27  Goal of Therapy:  INR 2-3 Monitor platelets by anticoagulation protocol: Yes   Plan:  1) Coumadin 2.5 mg po x 1 2) Daily INR  Thank you Okey Regal, PharmD [phone] 1:24 PM, 08/05/2015

## 2015-08-05 NOTE — Progress Notes (Signed)
Interpreter Wyvonnia Dusky for Dr Leeroy Bock

## 2015-08-05 NOTE — Progress Notes (Signed)
    Subjective:  History obtained with help of interpreter Denies CP or dyspnea   Objective:  Filed Vitals:   08/04/15 0621 08/04/15 1210 08/04/15 2215 08/05/15 0633  BP: 104/52 117/66 123/71 115/64  Pulse: 54 52 52 55  Temp: 97.4 F (36.3 C) 98 F (36.7 C) 97.9 F (36.6 C) 98 F (36.7 C)  TempSrc: Oral Oral Oral Oral  Resp: 16 18 18 17   Height:      Weight: 172 lb 1.6 oz (78.064 kg)   173 lb (78.472 kg)  SpO2: 100% 100% 100% 98%    Intake/Output from previous day:  Intake/Output Summary (Last 24 hours) at 08/05/15 1016 Last data filed at 08/05/15 0423  Gross per 24 hour  Intake    320 ml  Output    645 ml  Net   -325 ml    Physical Exam: Physical exam: Well-developed well-nourished in no acute distress.  Skin is warm and dry.  HEENT is normal.  Neck is supple.  Chest is clear to auscultation with normal expansion.  Cardiovascular exam is regular rate and rhythm.  Abdominal exam nontender or distended. No masses palpated. Extremities show no edema. neuro grossly intact    Lab Results: Basic Metabolic Panel:  Recent Labs  11/73/56 0450 08/04/15 0233  NA 142 141  K 4.4 3.7  CL 106 103  CO2 27 25  GLUCOSE 149* 177*  BUN 67* 74*  CREATININE 2.11* 2.33*  CALCIUM 9.1 9.1   CBC:  Recent Labs  08/04/15 0233  WBC 7.7  HGB 11.0*  HCT 34.5*  MCV 73.7*  PLT 135*   Cardiac Enzymes:  Recent Labs  08/03/15 0450  TROPONINI 0.05*     Assessment/Plan:  1 coronary artery disease- continue Plavix and statin. No aspirin as he is on Coumadin long-term. 2 acute on chronic systolic congestive heart failure-much improved. Continue present dose of Lasix. 3 ischemic cardiomyopathy-there appears to be some discrepancy in LV function assessment previously. Per records it was initially diminished but then recovered. Unclear if second echocardiogram was accurate as he has had congestive heart failure symptoms since then. Echocardiogram now shows ejection  fraction 20-25%. Continue beta blocker. We cannot use an ACE inhibitor because of baseline renal insufficiency. Continue hydralazine/nitrates. Needs ischemia evaluation to exclude worsening coronary disease as cause of newly decreased LV function. Discussed options today. I would like to avoid cardiac catheterization due to risk of contrast nephropathy We will therefore proceed with stress nuclear study tomorrow. If it shows predominantly infarct and no ischemia we will treat medically. When he returns to the [place] he will need reassessment of LV function after medication titration and if ejection fraction less than 35% would need to be considered for ICD. Resume Coumadin at discharge and follow-up with his cardiologist for further management. 4 chronic stage IV renal disease-follow renal function closely with diuresis.  Olga Millers 08/05/2015, 10:16 AM

## 2015-08-06 ENCOUNTER — Inpatient Hospital Stay (HOSPITAL_BASED_OUTPATIENT_CLINIC_OR_DEPARTMENT_OTHER): Payer: MEDICAID

## 2015-08-06 ENCOUNTER — Inpatient Hospital Stay (HOSPITAL_COMMUNITY): Payer: MEDICAID

## 2015-08-06 DIAGNOSIS — I509 Heart failure, unspecified: Secondary | ICD-10-CM | POA: Insufficient documentation

## 2015-08-06 DIAGNOSIS — I5041 Acute combined systolic (congestive) and diastolic (congestive) heart failure: Secondary | ICD-10-CM

## 2015-08-06 DIAGNOSIS — I249 Acute ischemic heart disease, unspecified: Secondary | ICD-10-CM

## 2015-08-06 LAB — NM MYOCAR MULTI W/SPECT W/WALL MOTION / EF
CHL CUP NUCLEAR SDS: 3
CHL CUP NUCLEAR SRS: 22
CHL CUP NUCLEAR SSS: 25
CHL CUP RESTING HR STRESS: 58 {beats}/min
CHL CUP STRESS STAGE 1 DBP: 85 mmHg
CHL CUP STRESS STAGE 3 GRADE: 0 %
CHL CUP STRESS STAGE 3 SPEED: 0 mph
CHL CUP STRESS STAGE 4 GRADE: 0 %
CHL CUP STRESS STAGE 4 HR: 71 {beats}/min
CHL CUP STRESS STAGE 4 SPEED: 0 mph
CSEPEW: 1 METS
CSEPPBP: 157 mmHg
LV dias vol: 246 mL (ref 62–150)
LV sys vol: 177 mL
Peak HR: 71 {beats}/min
Percent of predicted max HR: 46 %
RATE: 0
Stage 1 Grade: 0 %
Stage 1 HR: 64 {beats}/min
Stage 1 SBP: 143 mmHg
Stage 1 Speed: 0 mph
Stage 2 Grade: 0 %
Stage 2 HR: 64 {beats}/min
Stage 2 Speed: 0 mph
Stage 3 DBP: 83 mmHg
Stage 3 HR: 73 {beats}/min
Stage 3 SBP: 153 mmHg
Stage 4 DBP: 89 mmHg
Stage 4 SBP: 157 mmHg
TID: 1.05

## 2015-08-06 LAB — GLUCOSE, CAPILLARY
GLUCOSE-CAPILLARY: 78 mg/dL (ref 65–99)
Glucose-Capillary: 207 mg/dL — ABNORMAL HIGH (ref 65–99)
Glucose-Capillary: 370 mg/dL — ABNORMAL HIGH (ref 65–99)

## 2015-08-06 LAB — BASIC METABOLIC PANEL
Anion gap: 10 (ref 5–15)
BUN: 79 mg/dL — AB (ref 6–20)
CALCIUM: 8.9 mg/dL (ref 8.9–10.3)
CO2: 24 mmol/L (ref 22–32)
CREATININE: 2.49 mg/dL — AB (ref 0.61–1.24)
Chloride: 105 mmol/L (ref 101–111)
GFR calc Af Amer: 29 mL/min — ABNORMAL LOW (ref >60)
GFR, EST NON AFRICAN AMERICAN: 25 mL/min — AB (ref >60)
GLUCOSE: 187 mg/dL — AB (ref 65–99)
POTASSIUM: 4.4 mmol/L (ref 3.5–5.1)
SODIUM: 139 mmol/L (ref 135–145)

## 2015-08-06 LAB — PROTIME-INR
INR: 2.05 — AB (ref 0.00–1.49)
PROTHROMBIN TIME: 23 s — AB (ref 11.6–15.2)

## 2015-08-06 MED ORDER — TECHNETIUM TC 99M SESTAMIBI - CARDIOLITE
30.0000 | Freq: Once | INTRAVENOUS | Status: AC | PRN
  Administered 2015-08-06: 30 via INTRAVENOUS

## 2015-08-06 MED ORDER — REGADENOSON 0.4 MG/5ML IV SOLN
0.4000 mg | Freq: Once | INTRAVENOUS | Status: AC
  Administered 2015-08-06: 0.4 mg via INTRAVENOUS
  Filled 2015-08-06: qty 5

## 2015-08-06 MED ORDER — WARFARIN SODIUM 5 MG PO TABS
5.0000 mg | ORAL_TABLET | Freq: Once | ORAL | Status: AC
  Administered 2015-08-06: 5 mg via ORAL
  Filled 2015-08-06: qty 1

## 2015-08-06 MED ORDER — TECHNETIUM TC 99M SESTAMIBI - CARDIOLITE
10.0000 | Freq: Once | INTRAVENOUS | Status: AC | PRN
  Administered 2015-08-06: 09:00:00 10 via INTRAVENOUS

## 2015-08-06 MED ORDER — REGADENOSON 0.4 MG/5ML IV SOLN
INTRAVENOUS | Status: AC
  Administered 2015-08-06: 0.4 mg via INTRAVENOUS
  Filled 2015-08-06: qty 5

## 2015-08-06 NOTE — Progress Notes (Signed)
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 He presented complaining of increased dyspnea on exertion, shortness of breath, and increased swelling in his lower extremities. His condition has improved on Lasix and he reports that the swelling has gone down. He is now back on his home lasix dose. Cardiology was consulted due to low EF and complicated cardiac history. He had nuclear stress test this AM.  Assessment/Plan: CHF (congestive heart failure) (HCC) Mixed systolic and diastolic which would make it acute on chronic - We'll continue diuresis patient's condition is improving on current regimen. - Echocardiogram results and showing EF of 20-25% - resolved and patient back on home lasix regimen  Non sustained V tach - given EF of 20-25% consulted cardiology - discussed care moving forward with cardiology, await stress results from today 5/3  Active Problems:  CAD (coronary artery disease) - Plavix currently and will continue warfarin per cardiology recommendations.   Type 2 diabetes mellitus (HCC) - Will continue current insulin regimen - We'll place on diabetic diet   CKD stage 3 due to type 2 diabetes mellitus (HCC)  Dyslipidemia - Stable continue statin   Warfarin toxicity - resolved    Thrombocytopenia (HCC)   Pulmonary nodule - Patient has 9 mm left upper lobe pulmonary nodule radiologist recommends repeating tests in 3 months    Code Status: Full   Family Communication: D/w pt and wife at bedside this PM with onsite interpreter   Disposition Plan: Home in AM   Consultants:  Cardiology   Procedures:  Nuclear stress 5/3   Antimicrobials:  None    Objective: Filed Vitals:   08/06/15 1042 08/06/15 1044  08/06/15 1045 08/06/15 1255  BP: 153/83 157/89  135/68  Pulse: 74 71 72 69  Temp:    98.1 F (36.7 C)  TempSrc:    Oral  Resp:    20  Height:      Weight:      SpO2:    100%    Intake/Output Summary (Last 24 hours) at 08/06/15 1445 Last data filed at 08/06/15 1021  Gross per 24 hour  Intake    240 ml  Output    350 ml  Net   -110 ml   Filed Weights   08/04/15 0621 08/05/15 0633 08/06/15 0540  Weight: 78.064 kg (172 lb 1.6 oz) 78.472 kg (173 lb) 78.245 kg (172 lb 8 oz)    Exam: Awake Alert, Oriented X 3, No new F.N deficits, Normal affect Supple Neck,No JVD, No cervical lymphadenopathy appriciated.  Symmetrical Chest wall movement, Good air movement bilaterally, CTAB RRR,No Gallops,Rubs or new Murmurs +ve B.Sounds, Abd Soft, No tenderness, No organomegaly appriciated, No rebound - guarding or rigidity. No Cyanosis, Clubbing, No new Rash or bruise, + trace edema at LE   Data Reviewed: CBC:  Recent Labs Lab 08/01/15 1610 08/02/15 0255 08/04/15 0233  WBC 8.1 8.9 7.7  HGB 11.0* 10.4* 11.0*  HCT 35.6* 33.4* 34.5*  MCV 75.6* 73.9* 73.7*  PLT 143* 154 135*   Basic Metabolic Panel:  Recent Labs Lab 08/01/15 1610 08/02/15 0255 08/03/15 0450 08/04/15 0233 08/06/15 0320  NA 138 141 142 141 139  K 4.6 3.7 4.4 3.7 4.4  CL 105 107 106 103 105  CO2 22 23 27 25 24   GLUCOSE 302* 93 149* 177* 187*  BUN 78* 72* 67* 74* 79*  CREATININE 2.19* 2.12* 2.11* 2.33* 2.49*  CALCIUM 8.9 9.2 9.1 9.1 8.9   GFR: Estimated Creatinine Clearance: 27.3 mL/min (by C-G formula based on Cr of 2.49). Liver Function Tests:  Recent Labs Lab 08/02/15 0255  AST 32  ALT 46  ALKPHOS 60  BILITOT 0.8  PROT 6.3*  ALBUMIN 3.1*   No results for input(s): LIPASE, AMYLASE in the last 168 hours. No results for input(s): AMMONIA in the last 168 hours. Coagulation Profile:  Recent Labs Lab 08/02/15 0255 08/03/15 0450 08/04/15 0233 08/05/15 0220 08/06/15 0320  INR 4.81* 3.96* 0.69  2.59* 2.05*   Cardiac Enzymes:  Recent Labs Lab 08/01/15 2154 08/02/15 08/02/15 0255 08/03/15 0450  TROPONINI 0.04* 0.05* 0.05* 0.05*   BNP (last 3 results) No results for input(s): PROBNP in the last 8760 hours. HbA1C: No results for input(s): HGBA1C in the last 72 hours. CBG:  Recent Labs Lab 08/05/15 1119 08/05/15 1622 08/05/15 2043 08/06/15 0638 08/06/15 1301  GLUCAP 154* 242* 254* 78 207*   Lipid Profile: No results for input(s): CHOL, HDL, LDLCALC, TRIG, CHOLHDL, LDLDIRECT in the last 72 hours. Thyroid Function Tests: No results for input(s): TSH, T4TOTAL, FREET4, T3FREE, THYROIDAB in the last 72 hours. Anemia Panel: No results for input(s): VITAMINB12, FOLATE, FERRITIN, TIBC, IRON, RETICCTPCT in the last 72 hours. Urine analysis: No results found for: COLORURINE, APPEARANCEUR, LABSPEC, PHURINE, GLUCOSEU, HGBUR, BILIRUBINUR, KETONESUR, PROTEINUR, UROBILINOGEN, NITRITE, LEUKOCYTESUR Sepsis Labs: @LABRCNTIP (procalcitonin:4,lacticidven:4)  )No results found for this or any previous visit (from the past 240 hour(s)).    Studies: Nm Myocar Multi W/spect W/wall Motion / Ef  08/06/2015   There was no ST segment deviation noted during stress.  Defect 1: There is a large defect of severe severity.  This is a high risk study.  Nuclear stress EF: 28%.  No T wave inversion was noted during stress.  No reversible ischemia. Large apical infarct. LVEF 28% with dilated LV and severe global hypokinesis with apical akinesis. This is a high risk study.    Scheduled Meds: . atorvastatin  80 mg Oral q1800  . carvedilol  12.5 mg Oral Daily  . clopidogrel  75 mg Oral Daily  . furosemide  40 mg Oral BID  . hydrALAZINE  10 mg Oral Q8H  . insulin aspart  0-5 Units Subcutaneous QHS  . insulin aspart  0-9 Units Subcutaneous TID WC  . insulin NPH Human  30 Units Subcutaneous BID AC & HS  . isosorbide mononitrate  30 mg Oral Daily  . pantoprazole  40 mg Oral Daily  . potassium  chloride  10 mEq Oral BID  . warfarin  5 mg Oral ONCE-1800  . Warfarin - Pharmacist Dosing Inpatient   Does not apply q1800    Continuous Infusions:    LOS: 4 days   Time spent: [address]  Mir Vergie Living, MD [facility] Pager [phone]  If 7PM-7AM, please contact night-coverage [email or web address] Password [email or web address] 08/06/2015, 2:45 PM

## 2015-08-06 NOTE — Progress Notes (Signed)
Patient Name: Duane Martinez Date of Encounter: 08/06/2015  Primary Cardiologist: Dr. Jens Som   Principal Problem:   CHF (congestive heart failure) (HCC) Active Problems:   CAD (coronary artery disease)   Type 2 diabetes mellitus (HCC)   CKD stage 3 due to type 2 diabetes mellitus (HCC)   Dyslipidemia   Warfarin toxicity   Thrombocytopenia (HCC)   Pulmonary nodule    SUBJECTIVE  No CP.  A little SOB,  No orthopnea  CURRENT MEDS . regadenoson      . atorvastatin  80 mg Oral q1800  . carvedilol  12.5 mg Oral Daily  . clopidogrel  75 mg Oral Daily  . furosemide  40 mg Oral BID  . hydrALAZINE  10 mg Oral Q8H  . insulin aspart  0-5 Units Subcutaneous QHS  . insulin aspart  0-9 Units Subcutaneous TID WC  . insulin NPH Human  30 Units Subcutaneous BID AC & HS  . isosorbide mononitrate  30 mg Oral Daily  . pantoprazole  40 mg Oral Daily  . potassium chloride  10 mEq Oral BID  . regadenoson  0.4 mg Intravenous Once  . warfarin  5 mg Oral ONCE-1800  . Warfarin - Pharmacist Dosing Inpatient   Does not apply q1800    OBJECTIVE  Filed Vitals:   08/05/15 1216 08/05/15 2011 08/06/15 0540 08/06/15 1029  BP: 113/62 133/72 131/78 143/85  Pulse: 49 61 58 63  Temp: 97.7 F (36.5 C) 97.9 F (36.6 C) 98 F (36.7 C)   TempSrc: Oral Oral Oral   Resp: Height:      Weight:   172 lb 8 oz (78.245 kg)   SpO2: 100% 100% 100%     Intake/Output Summary (Last 24 hours) at 08/06/15 1035 Last data filed at 08/06/15 1021  Gross per 24 hour  Intake    480 ml  Output    550 ml  Net    -70 ml   Filed Weights   08/04/15 0621 08/05/15 0633 08/06/15 0540  Weight: 172 lb 1.6 oz (78.064 kg) 173 lb (78.472 kg) 172 lb 8 oz (78.245 kg)    PHYSICAL EXAM  General: Pleasant, NAD. Neuro: Alert and oriented X 3. Moves all extremities spontaneously. Psych: Normal affect. HEENT:  Normal  Neck: No JVD. Lungs:  Resp regular and unlabored, CTA. Heart: RRR no s3, s4, or  murmurs. Extremities: No clubbing, cyanosis or edema. DP/PT/Radials 2+ and equal bilaterally.  Accessory Clinical Findings  CBC  Recent Labs  08/04/15 0233  WBC 7.7  HGB 11.0*  HCT 34.5*  MCV 73.7*  PLT 135*   Basic Metabolic Panel  Recent Labs  08/04/15 0233 08/06/15 0320  NA 141 139  K 3.7 4.4  CL 103 105  CO2 25 24  GLUCOSE 177* 187*  BUN 74* 79*  CREATININE 2.33* 2.49*  CALCIUM 9.1 8.9    Rhythm strip/EKG in Nuc med. Sinus brady 58 periodic multiform PVCs.     Radiology/Studies  Dg Chest 2 View  08/01/2015  CLINICAL DATA:  Chest pain and tightness. Shortness of breath. Prior myocardial infarction in June. Productive cough. EXAM: CHEST  2 VIEW COMPARISON:  None FINDINGS: Mild enlargement of the cardiopericardial silhouette with indistinct pulmonary vasculature and mild interstitial accentuation including Kerley B-lines, favoring a low-level interstitial edema. No airspace edema identified. Bifid right fifth rib. On the left side projecting over the anterior fourth rib there is a 10 mm nodular density. T12 anterior wedge compression,  age indeterminate. IMPRESSION: 1. 10 mm nodular density projecting in over the left mid lung. CT chest (with contrast if feasible) is recommended to assess for malignancy. 2. Mild cardiomegaly with interstitial edema including Kerley B-lines. 3. Incidental bifid right fifth rib. Electronically Signed   By: Gaylyn Rong M.D.   On: 08/01/2015 16:24   Ct Chest Wo Contrast  08/01/2015  CLINICAL DATA:  Chest pain and shortness of breath for 2 weeks, possible pulmonary nodule EXAM: CT CHEST WITHOUT CONTRAST TECHNIQUE: Multidetector CT imaging of the chest was performed following the standard protocol without IV contrast. COMPARISON:  08/01/2015 FINDINGS: The study is limited by respiratory motion. There is a pulmonary nodule in the left upper lobe as suspected on the chest radiograph. It measures approximately 9 mm. Measurements are in  precise given the motion. Kerley B-lines noted bilaterally in the lower third lung zones. There is a small left and moderate right pleural effusion. Given limited evaluation without contrast, no significant hilar mass or adenopathy. There are prominent mediastinal lymph nodes, the largest in the precarinal region measuring 13 mm. No significant pericardial effusion. There is cardiac enlargement, mild. There is coronary artery calcification involving all 3 coronary arteries. Images through the upper abdomen demonstrate no acute findings. There is an approximately 50% compression deformity involving the anterior half of the T12 vertebral body. The endplates are involves both superiorly and inferiorly but both appear corticated. There is T11-12 and T12-L1 vacuum phenomenon in the disc. No significant retropulsion. IMPRESSION: Findings consistent with clearing pulmonary edema with bilateral pleural effusions. 9 mm left upper lobe pulmonary nodule. Consider one of the following in 3 months for both low-risk and high-risk individuals: (a) repeat chest CT, (b) follow-up PET-CT, or (c) tissue sampling. This recommendation follows the consensus statement: Guidelines for Management of Incidental Pulmonary Nodules Detected on CT Images:From the Fleischner Society 2017; published online before print (10.1148/radiol.[id number]). T12 compression deformity appears subacute to chronic. Electronically Signed   By: Esperanza Heir M.D.   On: 08/01/2015 18:12    ASSESSMENT AND PLAN  1. Acute on chronic combined systolic and diastolic HF  Net fluids: -93ml/-2.4L Echo 08/03/2015 EF 20-25%, severe diffuse hypokinesis, akinesis of apical myocardium, grade 4 DD, mild MR, PA peak pressure The patient appears euvolemic on exam.  He is on PO lasix 40mg  BID, which is his home dose.  Continue current dose.     2. Bradycardia; corlanor stopped, continued on coreg  Stable. 3. CAD: ASA stopped due to need for plavix and  coumadin.  Lexiscan completed today.  Results pending.    4. ICM with EF 20-25%  - pending stress myoview  5. CKD stage III:  Slight increase in SCr.   6. DM II 7. HLD  Signed, Wilburt Finlay PA-C Pager: [phone] As above, patient seen and examined. Patient denies chest pain or dyspnea. 1 coronary artery disease- continue Plavix and statin. No aspirin as he is on Coumadin long-term. 2 acute on chronic systolic congestive heart failure-much improved. Continue present dose of Lasix. 3 ischemic cardiomyopathy-there appears to be some discrepancy in LV function assessment previously. Per records it was initially diminished but then recovered. Unclear if second echocardiogram was accurate as he has had congestive heart failure symptoms since then. Echocardiogram now shows ejection fraction 20-25%. Continue beta blocker. We cannot use an ACE inhibitor because of baseline renal insufficiency. Continue hydralazine/nitrates. Needs ischemia evaluation to exclude worsening coronary disease as cause of newly decreased LV function. I would like to avoid  cardiac catheterization due to risk of contrast nephropathy We will therefore proceed with stress nuclear study today. If it shows predominantly infarct and no ischemia we will treat medically. When he returns to the [place] he will need reassessment of LV function after medication titration and if ejection fraction less than 35% would need to be considered for ICD. Continue coumadin at discharge and follow-up with his cardiologist for further management. 4 chronic stage IV renal disease-follow renal function closely with diuresis. Patient can be discharged from a cardiac standpoint as outlined above if nuclear study shows infarct and no ischemia. Follow-up with his cardiologist in [place]. Olga Millers

## 2015-08-06 NOTE — Progress Notes (Signed)
Myoview result below:  There was no ST segment deviation noted during stress.  Defect 1: There is a large defect of severe severity.  This is a high risk study.  Nuclear stress EF: 28%.  No T wave inversion was noted during stress.  No reversible ischemia. Large apical infarct. LVEF 28% with dilated LV and severe global hypokinesis with apical akinesis. This is a high risk study.   High risk study due to large scar and LV dysfunction, but no ischemia or reversible blockage seen. Discussed with Dr. Jens Som, no further workup planned.   Paged Dr. Kirby Crigler, has not heard back, text paged him to inform clear for discharge.  With help of translator, I have discussed with patient and family heart failure prevention:  1. Avoid salt  2. Limit daily fluid intake to less than 2 liters  3. Weigh himself every morning, call his cardiologist for potential advise to take additional dose of lasix if weight increase by more than 3 lbs overnight or 5 lbs in a single week.   Ramond Dial PA Pager: [phone]

## 2015-08-06 NOTE — Progress Notes (Signed)
Patient is discharge to home  At 6:00 pm accompanied by patient family memebers and NT via wheelchair. Discharge instructions given . Patient verbalizes understanding. All personal belongings given. Telemetry box and IV removed prior to discharge and site in good condition.

## 2015-08-06 NOTE — Discharge Summary (Addendum)
Discharge Summary  Duane Martinez WUJ:[id number] DOB: 1950-08-04  PCP: No PCP Per Patient  Admit date: 08/01/2015 Discharge date: 08/06/2015  Time spent: 40   Recommendations for Outpatient Follow-up:  1. See your outpatient Cardiologist to follow up on your EF and consider defibrillator if remains low.   Discharge Diagnoses:  Active Hospital Problems   Diagnosis Date Noted  . CHF (congestive heart failure) (HCC) 08/01/2015  . ACS (acute coronary syndrome) (HCC)   . Acute congestive heart failure (HCC)   . CAD (coronary artery disease) 08/01/2015  . Type 2 diabetes mellitus (HCC) 08/01/2015  . CKD stage 3 due to type 2 diabetes mellitus (HCC) 08/01/2015  . Dyslipidemia 08/01/2015  . Warfarin toxicity 08/01/2015  . Thrombocytopenia (HCC) 08/01/2015  . Pulmonary nodule 08/01/2015    Resolved Hospital Problems   Diagnosis Date Noted Date Resolved  No resolved problems to display.    Discharge Condition: Improved, stable.   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 He presented complaining of increased dyspnea on exertion, shortness of breath, and increased swelling in his lower extremities. His condition has improved on Lasix and he reports that the swelling has gone down. He is now back on his home lasix dose. Cardiology was consulted due to low EF and complicated cardiac history. He had nuclear stress test this AM, area of large apical infarction without ishcemia. High risk study, but stable with no further workup planned by cardiology. Cardiology recommends home today on home medications and close cardiology follow up when he returns home to [place].  Assessment/Plan: CHF (congestive  heart failure) (HCC) Mixed systolic and diastolic which would make it acute on chronic - Has improved with diuresis. - Echocardiogram results and showing EF of 20-25% - resolved and patient back on home lasix regimen  Non sustained V tach - given EF of 20-25% consulted cardiology - discussed care moving forward with cardiology, await stress results from today 5/3  Active Problems: HTN - on Coreg   CAD (coronary artery disease) - Plavix currently and will continue warfarin per cardiology recommendations.   Type 2 diabetes mellitus (HCC) - Will continue current insulin regimen - We'll place on diabetic diet   CKD stage 3 due to type 2 diabetes mellitus (HCC)  Dyslipidemia - Stable continue statin   Warfarin toxicity - resolved    Thrombocytopenia (HCC)   Pulmonary nodule - Patient has 9 mm left upper lobe pulmonary nodule radiologist recommends repeating tests in 3 months   Consultants:  Cardiology  Procedures: Nuclear stress 5/3  No reversible ischemia. Large apical infarct. LVEF 28% with dilated LV and severe global hypokinesis with apical akinesis. This is a high risk study.  Antimicrobials:  None  Discharge Exam: BP 135/68 mmHg  Pulse 69  Temp(Src) 98.1 F (36.7 C) (Oral)  Resp 20  Ht  (1.702 m)  Wt 78.245 kg (172 lb 8 oz)  BMI 27.01 kg/m2  SpO2 100%  Awake Alert, Oriented X 3, No new F.N deficits, Normal affect Supple Neck,No JVD, No cervical lymphadenopathy appriciated.  Symmetrical Chest wall movement, Good air movement bilaterally, CTAB RRR,No Gallops,Rubs or new Murmurs +ve  B.Sounds, Abd Soft, No tenderness, No organomegaly appriciated, No rebound - guarding or rigidity. No Cyanosis, Clubbing, No new Rash or bruise, + trace edema at LE  Discharge Instructions You were cared for by a hospitalist during your hospital stay. If you have any questions about your discharge medications or the care you received while you were in the  hospital after you are discharged, you can call the unit and asked to speak with the hospitalist on call if the hospitalist that took care of you is not available. Once you are discharged, your primary care physician will handle any further medical issues. Please note that NO REFILLS for any discharge medications will be authorized once you are discharged, as it is imperative that you return to your primary care physician (or establish a relationship with a primary care physician if you do not have one) for your aftercare needs so that they can reassess your need for medications and monitor your lab values.  Discharge Instructions    Diet - low sodium heart healthy    Complete by:  As directed      Increase activity slowly    Complete by:  As directed             Medication List    STOP taking these medications        ivabradine 7.5 MG Tabs tablet  Commonly known as:  CORLANOR      TAKE these medications        acetaminophen 500 MG tablet  Commonly known as:  TYLENOL  Take 500 mg by mouth every 8 (eight) hours as needed for moderate pain.     atorvastatin 80 MG tablet  Commonly known as:  LIPITOR  Take 80 mg by mouth daily at 6 PM.     carvedilol 12.5 MG tablet  Commonly known as:  COREG  Take 12.5 mg by mouth daily.     clopidogrel 75 MG tablet  Commonly known as:  PLAVIX  Take 75 mg by mouth 2 (two) times daily.     furosemide 40 MG tablet  Commonly known as:  LASIX  Take 40 mg by mouth 2 (two) times daily.     insulin NPH Human 100 UNIT/ML injection  Commonly known as:  HUMULIN N,NOVOLIN N  Inject 30 Units into the skin 2 (two) times daily before a meal.     isosorbide mononitrate 30 MG 24 hr tablet  Commonly known as:  IMDUR  Take 1 tablet (30 mg total) by mouth daily.     multivitamin with minerals Tabs tablet  Take 1 tablet by mouth 2 (two) times daily.     PREVACID 30 MG capsule  Generic drug:  lansoprazole  Take 30 mg by mouth daily at 12 noon.      warfarin 5 MG tablet  Commonly known as:  COUMADIN  Take 5 mg by mouth daily at 6 PM. 2.5MG  ON MON, WED FRI AND SUN TAKES 5MG  ALL OTHER DAYS       No Known Allergies    The results of significant diagnostics from this hospitalization (including imaging, microbiology, ancillary and laboratory) are listed below for reference.    Significant Diagnostic Studies: Dg Chest 2 View  08/01/2015  CLINICAL DATA:  Chest pain and tightness. Shortness of breath. Prior myocardial infarction in June. Productive cough. EXAM: CHEST  2 VIEW COMPARISON:  None FINDINGS: Mild enlargement of the cardiopericardial silhouette with indistinct pulmonary vasculature and mild interstitial accentuation including Kerley B-lines, favoring  a low-level interstitial edema. No airspace edema identified. Bifid right fifth rib. On the left side projecting over the anterior fourth rib there is a 10 mm nodular density. T12 anterior wedge compression, age indeterminate. IMPRESSION: 1. 10 mm nodular density projecting in over the left mid lung. CT chest (with contrast if feasible) is recommended to assess for malignancy. 2. Mild cardiomegaly with interstitial edema including Kerley B-lines. 3. Incidental bifid right fifth rib. Electronically Signed   By: Gaylyn Rong M.D.   On: 08/01/2015 16:24   Ct Chest Wo Contrast  08/01/2015  CLINICAL DATA:  Chest pain and shortness of breath for 2 weeks, possible pulmonary nodule EXAM: CT CHEST WITHOUT CONTRAST TECHNIQUE: Multidetector CT imaging of the chest was performed following the standard protocol without IV contrast. COMPARISON:  08/01/2015 FINDINGS: The study is limited by respiratory motion. There is a pulmonary nodule in the left upper lobe as suspected on the chest radiograph. It measures approximately 9 mm. Measurements are in precise given the motion. Kerley B-lines noted bilaterally in the lower third lung zones. There is a small left and moderate right pleural effusion. Given  limited evaluation without contrast, no significant hilar mass or adenopathy. There are prominent mediastinal lymph nodes, the largest in the precarinal region measuring 13 mm. No significant pericardial effusion. There is cardiac enlargement, mild. There is coronary artery calcification involving all 3 coronary arteries. Images through the upper abdomen demonstrate no acute findings. There is an approximately 50% compression deformity involving the anterior half of the T12 vertebral body. The endplates are involves both superiorly and inferiorly but both appear corticated. There is T11-12 and T12-L1 vacuum phenomenon in the disc. No significant retropulsion. IMPRESSION: Findings consistent with clearing pulmonary edema with bilateral pleural effusions. 9 mm left upper lobe pulmonary nodule. Consider one of the following in 3 months for both low-risk and high-risk individuals: (a) repeat chest CT, (b) follow-up PET-CT, or (c) tissue sampling. This recommendation follows the consensus statement: Guidelines for Management of Incidental Pulmonary Nodules Detected on CT Images:From the Fleischner Society 2017; published online before print (10.1148/radiol.1610960454). T12 compression deformity appears subacute to chronic. Electronically Signed   By: Esperanza Heir M.D.   On: 08/01/2015 18:12   Nm Myocar Multi W/spect W/wall Motion / Ef  08/06/2015   There was no ST segment deviation noted during stress.  Defect 1: There is a large defect of severe severity.  This is a high risk study.  Nuclear stress EF: 28%.  No T wave inversion was noted during stress.  No reversible ischemia. Large apical infarct. LVEF 28% with dilated LV and severe global hypokinesis with apical akinesis. This is a high risk study.    Microbiology: No results found for this or any previous visit (from the past 240 hour(s)).   Labs: Basic Metabolic Panel:  Recent Labs Lab 08/01/15 1610 08/02/15 0255 08/03/15 0450 08/04/15 0233  08/06/15 0320  NA 138 141 142 141 139  K 4.6 3.7 4.4 3.7 4.4  CL 105 107 106 103 105  CO2 22 23 27 25 24   GLUCOSE 302* 93 149* 177* 187*  BUN 78* 72* 67* 74* 79*  CREATININE 2.19* 2.12* 2.11* 2.33* 2.49*  CALCIUM 8.9 9.2 9.1 9.1 8.9   Liver Function Tests:  Recent Labs Lab 08/02/15 0255  AST 32  ALT 46  ALKPHOS 60  BILITOT 0.8  PROT 6.3*  ALBUMIN 3.1*   No results for input(s): LIPASE, AMYLASE in the last 168 hours. No results for input(s):  AMMONIA in the last 168 hours. CBC:  Recent Labs Lab 08/01/15 1610 08/02/15 0255 08/04/15 0233  WBC 8.1 8.9 7.7  HGB 11.0* 10.4* 11.0*  HCT 35.6* 33.4* 34.5*  MCV 75.6* 73.9* 73.7*  PLT 143* 154 135*   Cardiac Enzymes:  Recent Labs Lab 08/01/15 2154 08/02/15 08/02/15 0255 08/03/15 0450  TROPONINI 0.04* 0.05* 0.05* 0.05*   BNP: BNP (last 3 results)  Recent Labs  08/01/15 1732  BNP 2316.8*    ProBNP (last 3 results) No results for input(s): PROBNP in the last 8760 hours.  CBG:  Recent Labs Lab 08/05/15 1119 08/05/15 1622 08/05/15 2043 08/06/15 0638 08/06/15 1301  GLUCAP 154* 242* 254* 78 207*       Signed:  Mir NIKE  [facility] 08/06/2015, 4:54 PM

## 2015-08-06 NOTE — Consult Note (Signed)
ANTICOAGULATION CONSULT NOTE - Follow Up Consult  Pharmacy Consult for Coumadin Indication: prescribed by cardiologist in [place], hx CAD/CHF  No Known Allergies  Patient Measurements: Height: 5\' 7"  (170.2 cm) Weight: 172 lb 8 oz (78.245 kg) (scale a) IBW/kg (Calculated) : 66.1  Vital Signs: Temp: 98 F (36.7 C) (05/03 0540) Temp Source: Oral (05/03 0540) BP: 131/78 mmHg (05/03 0540) Pulse Rate: 58 (05/03 0540)  Labs:  Recent Labs  08/04/15 0233 08/05/15 0220 08/06/15 0320  HGB 11.0*  --   --   HCT 34.5*  --   --   PLT 135*  --   --   LABPROT 10.2* 27.4* 23.0*  INR 0.69 2.59* 2.05*  CREATININE 2.33*  --  2.49*    Estimated Creatinine Clearance: 27.3 mL/min (by C-G formula based on Cr of 2.49).  Assessment: 64yom on coumadin pta, admitted with chest pain likely due to HF exacerbation. INR on admit 4.76 and doses held.  INR now back within therapeutic range at 2.05  Home dose: 5mg  Mon/Wed/Fri/Sun, 2.5mg  Tue/Thu/Sat - last taken 4/27  Goal of Therapy:  INR 2-3 Monitor platelets by anticoagulation protocol: Yes   Plan:  1) Coumadin 5 mg po x 1 2) Daily INR  Thank you Okey Regal, PharmD [phone] 9:37 AM, 08/06/2015
